# Patient Record
Sex: Female | Born: 1980 | Race: White | Hispanic: No | Marital: Married | Smoking: Never smoker
Health system: Southern US, Community
[De-identification: ages and names within clinical notes are randomized; demographics above are authoritative.]

## PROBLEM LIST (undated history)

## (undated) DIAGNOSIS — Z872 Personal history of diseases of the skin and subcutaneous tissue: Secondary | ICD-10-CM

## (undated) DIAGNOSIS — G40909 Epilepsy, unspecified, not intractable, without status epilepticus: Secondary | ICD-10-CM

## (undated) DIAGNOSIS — N760 Acute vaginitis: Secondary | ICD-10-CM

## (undated) DIAGNOSIS — B9689 Other specified bacterial agents as the cause of diseases classified elsewhere: Secondary | ICD-10-CM

## (undated) DIAGNOSIS — G44219 Episodic tension-type headache, not intractable: Secondary | ICD-10-CM

## (undated) DIAGNOSIS — N83209 Unspecified ovarian cyst, unspecified side: Secondary | ICD-10-CM

## (undated) DIAGNOSIS — Z8669 Personal history of other diseases of the nervous system and sense organs: Secondary | ICD-10-CM

## (undated) HISTORY — DX: Epilepsy, unspecified, not intractable, without status epilepticus: G40.909

## (undated) HISTORY — DX: Acute vaginitis: N76.0

## (undated) HISTORY — DX: Other specified bacterial agents as the cause of diseases classified elsewhere: B96.89

## (undated) HISTORY — DX: Unspecified ovarian cyst, unspecified side: N83.209

## (undated) HISTORY — DX: Episodic tension-type headache, not intractable: G44.219

## (undated) HISTORY — DX: Personal history of other diseases of the nervous system and sense organs: Z86.69

## (undated) HISTORY — PX: WISDOM TOOTH EXTRACTION: SHX21

## (undated) HISTORY — DX: Personal history of diseases of the skin and subcutaneous tissue: Z87.2

---
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
## 2001-08-13 ENCOUNTER — Encounter (HOSPITAL_COMMUNITY): Admission: RE | Admit: 2001-08-13 | Discharge: 2001-09-12 | Payer: Self-pay

## 2001-09-12 ENCOUNTER — Encounter (HOSPITAL_COMMUNITY): Admission: RE | Admit: 2001-09-12 | Discharge: 2001-10-12 | Payer: Self-pay | Admitting: Orthopedic Surgery

## 2002-03-20 ENCOUNTER — Other Ambulatory Visit: Admission: RE | Admit: 2002-03-20 | Discharge: 2002-03-20 | Payer: Self-pay | Admitting: Obstetrics and Gynecology

## 2002-09-25 ENCOUNTER — Encounter: Payer: Self-pay | Admitting: Internal Medicine

## 2002-09-25 ENCOUNTER — Ambulatory Visit (HOSPITAL_COMMUNITY): Admission: RE | Admit: 2002-09-25 | Discharge: 2002-09-25 | Payer: Self-pay | Admitting: Internal Medicine

## 2002-10-17 ENCOUNTER — Ambulatory Visit (HOSPITAL_COMMUNITY): Admission: RE | Admit: 2002-10-17 | Discharge: 2002-10-17 | Payer: Self-pay | Admitting: Internal Medicine

## 2003-01-02 ENCOUNTER — Ambulatory Visit (HOSPITAL_COMMUNITY): Admission: RE | Admit: 2003-01-02 | Discharge: 2003-01-02 | Payer: Self-pay | Admitting: Internal Medicine

## 2003-01-02 ENCOUNTER — Encounter (INDEPENDENT_AMBULATORY_CARE_PROVIDER_SITE_OTHER): Payer: Self-pay | Admitting: Internal Medicine

## 2003-05-15 ENCOUNTER — Other Ambulatory Visit: Admission: RE | Admit: 2003-05-15 | Discharge: 2003-05-15 | Payer: Self-pay | Admitting: Obstetrics and Gynecology

## 2008-01-30 ENCOUNTER — Encounter: Admission: RE | Admit: 2008-01-30 | Discharge: 2008-01-30 | Payer: Self-pay | Admitting: Otolaryngology

## 2010-08-19 NOTE — Op Note (Signed)
NAME:  Monica Webb, Monica Webb                         ACCOUNT NO.:  [id number]   MEDICAL RECORD NO.:  [id number]                   PATIENT TYPE:  AMB   LOCATION:  DAY                                  FACILITY:  [facility]   PHYSICIAN:  R. Roetta Sessions, M.D.              DATE OF BIRTH:  Oct 11, 1980   DATE OF PROCEDURE:  10/17/2002  DATE OF DISCHARGE:                                  PROCEDURE NOTE   PROCEDURE:  Esophagogastroduodenoscopy with small-bowel biopsy followed by  colonoscopy with ileoscopy with biopsy.   ENDOSCOPIST:  Gerrit Friends. Rourk, M.D.   INDICATIONS FOR PROCEDURE:  The patient is a 30 year old lady with a 2-year  history of right-sided abdominal pain reporting a 50-pound weight loss in  the past 6 months. She also has had some burgundy stools recently. She has  been extensively evaluated already with numerous studies.  CT scan of the  abdomen and pelvis with IV oral contrast here at [facility] on June  24 demonstrated prominent right ovarian parenchyma consistent with a corpus  luteum cyst, a single uterine fibroid, normal appearing appendix.  No other  intrapelvic or intra-abdominal abnormalities were seen.  Colonoscopy and EGD  are now being done to further evaluate her symptoms.  This approach has been  discussed with the patient previously and, again, today at the beside.  Please see my office dictation of September 24, 2002 for more information.   PROCEDURE NOTE:  O2 saturation, blood pressure, pulse and respirations were  monitored throughout the entire procedure.  Conscious sedation: Versed 4 mg  IV, Demerol 75 mg IV in divided doses for both procedures.  Cetacaine spray  for topical oropharyngeal anesthesia.   FINDINGS:  EGD examination of the tubular esophagus revealed no mucosal  abnormalities.  The EG junction was easily traversed.   STOMACH:  The gastric cavity was empty.  It insufflated well with air.  A  thorough examination of the gastric mucosa including a  retroflex view of the  proximal stomach and esophagogastric junction demonstrated no abnormalities.  The pylorus was patent and easily traversed.   DUODENUM:  Examination of the bulb and the second portion revealed no  abnormalities.   THERAPEUTIC/DIAGNOSTIC MANEUVERS:  Biopsies of the second portion of the  duodenum were taken for histologic study.   The patient tolerated the procedure well and was prepared for colonoscopy.   FINDINGS:  A digital rectal examination revealed no abnormalities.   ENDOSCOPIC FINDINGS:  The prep was excellent.   RECTUM:  Examination of the rectal mucosa including the retroflex view of  the anal verge revealed minimal internal hemorrhoids, otherwise normal  rectum.   COLON:  The colonic mucosa was surveyed from the rectosigmoid junction  through the left transverse and right colon to the area of the appendiceal  orifice, ileocecal valve, and cecum.  These structures were well seen and  photographed for the record.  The colonic mucosa appeared entirely normal  all the way to the cecum.  The terminal ileum was intubated to 10 cm.  There  was some prominent lymphoid follicles.  There was some friability of the  ileal mucosa a little more than is typically seen; but, however, there were  no erosions or ulcerations.  I did elect to biopsy the ileal mucosa.   From this level the scope was slowly withdrawn.  All previously mentioned  mucosal surfaces were again seen; and, again, no other abnormalities were  observed.  The patient tolerated the procedure well and was reacted in  endoscopy   ESOPHAGOGASTRODUODENOSCOPY:  1. Normal esophagus, stomach, D1 and D2.  2. Biopsies of D2 taken.   COLONOSCOPY FINDINGS:  1. Normal internal hemorrhoids, otherwise normal rectum, normal colon.  2. Some friability of the ileal mucosa of uncertain clinical significance     biopsied.   RECOMMENDATIONS:  1. Will follow up on path.  2. Further recommendations to  follow.                                               Jonathon Bellows, M.D.    RMR/MEDQ  D:  10/17/2002  T:  10/17/2002  Job:  [id number]   cc:   Kirstie Peri, MD  [address]  [place] [address]  Fax: [phone]

## 2011-04-04 HISTORY — PX: CHOLECYSTECTOMY: SHX55

## 2012-04-21 HISTORY — PX: ABDOMINAL HYSTERECTOMY: SHX81

## 2012-04-29 ENCOUNTER — Ambulatory Visit: Payer: Self-pay | Admitting: Obstetrics and Gynecology

## 2012-04-29 ENCOUNTER — Encounter: Payer: Self-pay | Admitting: Obstetrics and Gynecology

## 2012-04-29 VITALS — BP 110/72 | Temp 98.4°F | Ht 65.0 in | Wt 175.0 lb

## 2012-04-29 DIAGNOSIS — N920 Excessive and frequent menstruation with regular cycle: Secondary | ICD-10-CM

## 2012-04-29 DIAGNOSIS — N760 Acute vaginitis: Secondary | ICD-10-CM

## 2012-04-29 DIAGNOSIS — Z01419 Encounter for gynecological examination (general) (routine) without abnormal findings: Secondary | ICD-10-CM

## 2012-04-29 DIAGNOSIS — N83209 Unspecified ovarian cyst, unspecified side: Secondary | ICD-10-CM

## 2012-04-29 DIAGNOSIS — R102 Pelvic and perineal pain: Secondary | ICD-10-CM

## 2012-04-29 DIAGNOSIS — Z124 Encounter for screening for malignant neoplasm of cervix: Secondary | ICD-10-CM

## 2012-04-29 DIAGNOSIS — N83201 Unspecified ovarian cyst, right side: Secondary | ICD-10-CM

## 2012-04-29 LAB — POCT WET PREP (WET MOUNT)
Whiff Test: NEGATIVE
pH: 4.5

## 2012-04-29 LAB — CBC
HCT: 39 % (ref 36.0–46.0)
Hemoglobin: 13.7 g/dL (ref 12.0–15.0)
MCH: 30.6 pg (ref 26.0–34.0)
MCHC: 35.1 g/dL (ref 30.0–36.0)
MCV: 87.1 fL (ref 78.0–100.0)
Platelets: 223 10*3/uL (ref 150–400)
RBC: 4.48 MIL/uL (ref 3.87–5.11)
RDW: 13.4 % (ref 11.5–15.5)
WBC: 6.1 10*3/uL (ref 4.0–10.5)

## 2012-04-29 NOTE — Patient Instructions (Signed)
Avoid: - excess soap on genital area (consider using plain oatmeal soap) - use of powder or sprays in genital area - douching - wearing underwear to bed (except with menses) - using more than is directed detergent when washing clothes - tight fitting garments around genital area - excess sugar intake   

## 2012-04-29 NOTE — Progress Notes (Signed)
Subjective:    Monica Webb is a 32 y.o. female, [phone], who presents for an annual exam. The patient reports the discovery of a large right ovarian cyst by MRI after she experienced a sharp pain on Christmas day that recurred off & on and became stabbing in nature.  She then, after a few weeks  she developed bloody stools with nausea, vomiting and intense pelvic pain.  At the ER an MRI showed, per patient, a large right ovarian cyst.  She was then treated with pain medications and told to follow up with her GYN. In the interim she experience a large volume of vaginal bleeding (not her period) that required the change of an overnight pad and super tampon every 3 hours x 6 days afterwards her pain was much better. Admits to feeling tired,  hemorrhoids, and nausea/vomiting with pain.   She denies urinary tract symptoms, intermenstrual bleeding, dyspareunia or previous history of ovarian cysts.  She went on to report that since her miscarriage in February 2013, she has had monthly yeast like symptoms that go away and return.  She also noticed on yesterday a non-tender "place" on her right thigh.  Denies any know trauma, scratch or exposure to anyone with a rash.   Menstrual cycle:   LMP: Patient's last menstrual period was 04/21/2012.  Flow 4-5 days with pad change every 6 hours; 7/10 cramps relieved with OTC analgesia and time            Review of Systems Pertinent items are noted in HPI. Denies pelvic pain, urinary tract symptoms, vaginitis symptoms, irregular bleeding, menopausal symptoms, change in bowel habits or rectal bleeding   Objective:    BP 110/72  Temp 98.4 F (36.9 C) (Oral)  Ht 5\' 5"  (1.651 m)  Wt 175 lb (79.379 kg)  BMI 29.12 kg/m2  LMP 04/21/2012   Wt Readings from Last 1 Encounters:  04/29/12 175 lb (79.379 kg)   Body mass index is 29.12 kg/(m^2). General Appearance: Alert, no acute distress HEENT: Grossly normal Neck / Thyroid: Supple, no thyromegaly or cervical  adenopathy Lungs: Clear to auscultation bilaterally Back: No CVA tenderness Breast Exam: No masses or nodes.No dimpling, nipple retraction or discharge. Cardiovascular: Regular rate and rhythm.  Gastrointestinal: Soft, diffusely tender without guarding, no masses or organomegaly Pelvic Exam: EGBUS-wnl, vagina-normal rugae, cervix- without lesions or tenderness, uterus appears normal size shape and consistency but tender, adnexae- tenderness on right but not on the left, no palpable masses in either adnexa Lymphatic Exam: Non-palpable nodes in neck, clavicular,  axillary, or inguinal regions  Skin: no rashes or abnormalities Extremities: no clubbing cyanosis or edema;  right anterior thigh with a 3 mm, stellate macular lesion that is not tender but has flaky borders with central pigmentation.  Neurologic: grossly normal Psychiatric: Alert and oriented   Wet Prep: pH-4.5, whiff-negative, no clue, yeast or trich   Assessment:   Routine GYN Exam History of "Large" Cyst on Right Ovary Vaginitis Symptoms Pelvic Pain-improving Menorrhagia/Fatigue Right Thigh Lesion   Plan:  CBC & U/S to evaluate right ovarian cyst  F/U with PCP for thigh lesion  Avoid: - excess soap on genital area (consider using plain oatmeal soap) - use of powder or sprays in genital area - douching - wearing underwear to bed (except with menses) - using more than is directed detergent when washing clothes - tight fitting garments around genital area - excess sugar intake   PAP sent  RTO 1 year or prn  Jazarah Capili,ELMIRAPA-C

## 2012-04-29 NOTE — Progress Notes (Signed)
Regular Periods: yes Mammogram: yes  Monthly Breast Ex.: yes Exercise: yes  Tetanus < 10 years: yes Seatbelts: yes  NI. Bladder Functn.: yes Abuse at home: no  Daily BM's: yes Stressful Work: no  Healthy Diet: yes Sigmoid-Colonoscopy: yes; 2001  Showed polyps  Calcium: no Medical problems this year: ovarian cyst    LAST PAP:3 years ago  Contraception: withdrawal  Mammogram:  6/12    Found small cyst on left breast  PCP: DR. Bruna Potter AT [facility]  PMH: NO CHANGE  FMH: NO CHANGE  Last Bone Scan: NO  PT IS MARRIED.

## 2012-04-30 LAB — PAP IG W/ RFLX HPV ASCU

## 2012-05-08 ENCOUNTER — Encounter: Payer: Self-pay | Admitting: Obstetrics and Gynecology

## 2012-05-08 ENCOUNTER — Ambulatory Visit: Payer: Self-pay | Admitting: Obstetrics and Gynecology

## 2012-05-08 ENCOUNTER — Ambulatory Visit: Payer: Self-pay

## 2012-05-08 VITALS — BP 100/62 | Temp 97.9°F | Wt 178.0 lb

## 2012-05-08 DIAGNOSIS — R102 Pelvic and perineal pain: Secondary | ICD-10-CM

## 2012-05-08 DIAGNOSIS — N83201 Unspecified ovarian cyst, right side: Secondary | ICD-10-CM

## 2012-05-08 DIAGNOSIS — N83209 Unspecified ovarian cyst, unspecified side: Secondary | ICD-10-CM

## 2012-05-08 NOTE — Progress Notes (Signed)
31 YO seen previously with a right ovarian cyst and pelvic pain returns for follow up ultrasound.  Since miscarriage has had frequent yeast symptoms. Symptoms have not responded consistently to any treatment.  Over the past month has had fingertips to get cold, go numb and look white.  Episodes will last about 7 minutes.  Hasn't had the experience with exposure to cold or cold water.   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 Raynaud's Phenomenon   P:  Follow up with Dr. Reuel Boom for Raynaud's like symptoms       Handout given on Cytolytic Vaginosis Management       Patient may call back for referral to the vaginitis clinic at [facility]       May take NSAIDs for pelvic discomfort until pelvic fluid resolves       RTO-as scheduled or prn  Brycelynn Stampley, PA-C

## 2014-02-02 ENCOUNTER — Encounter: Payer: Self-pay | Admitting: Obstetrics and Gynecology

## 2015-07-06 DIAGNOSIS — S60551A Superficial foreign body of right hand, initial encounter: Secondary | ICD-10-CM | POA: Diagnosis not present

## 2015-07-06 DIAGNOSIS — D485 Neoplasm of uncertain behavior of skin: Secondary | ICD-10-CM | POA: Diagnosis not present

## 2015-07-06 DIAGNOSIS — D225 Melanocytic nevi of trunk: Secondary | ICD-10-CM | POA: Diagnosis not present

## 2015-07-07 DIAGNOSIS — N801 Endometriosis of ovary: Secondary | ICD-10-CM | POA: Diagnosis not present

## 2015-07-07 DIAGNOSIS — R102 Pelvic and perineal pain: Secondary | ICD-10-CM | POA: Diagnosis not present

## 2015-07-07 DIAGNOSIS — N920 Excessive and frequent menstruation with regular cycle: Secondary | ICD-10-CM | POA: Diagnosis not present

## 2015-07-07 DIAGNOSIS — N72 Inflammatory disease of cervix uteri: Secondary | ICD-10-CM | POA: Diagnosis not present

## 2015-07-07 DIAGNOSIS — N800 Endometriosis of uterus: Secondary | ICD-10-CM | POA: Diagnosis not present

## 2015-07-12 DIAGNOSIS — R102 Pelvic and perineal pain: Secondary | ICD-10-CM | POA: Diagnosis not present

## 2015-07-12 DIAGNOSIS — D509 Iron deficiency anemia, unspecified: Secondary | ICD-10-CM | POA: Diagnosis not present

## 2015-07-15 ENCOUNTER — Other Ambulatory Visit (HOSPITAL_COMMUNITY): Payer: Self-pay | Admitting: Obstetrics & Gynecology

## 2015-07-15 ENCOUNTER — Ambulatory Visit (HOSPITAL_COMMUNITY)
Admission: RE | Admit: 2015-07-15 | Discharge: 2015-07-15 | Disposition: A | Discharge status: Home / Self Care | Payer: BLUE CROSS/BLUE SHIELD | Source: Ambulatory Visit | Attending: Obstetrics & Gynecology | Admitting: Obstetrics & Gynecology

## 2015-07-15 DIAGNOSIS — R102 Pelvic and perineal pain: Secondary | ICD-10-CM | POA: Insufficient documentation

## 2015-07-15 DIAGNOSIS — Z9049 Acquired absence of other specified parts of digestive tract: Secondary | ICD-10-CM | POA: Insufficient documentation

## 2015-07-15 DIAGNOSIS — S3720XA Unspecified injury of bladder, initial encounter: Secondary | ICD-10-CM

## 2015-07-15 DIAGNOSIS — Z90710 Acquired absence of both cervix and uterus: Secondary | ICD-10-CM | POA: Diagnosis not present

## 2015-07-15 DIAGNOSIS — Z09 Encounter for follow-up examination after completed treatment for conditions other than malignant neoplasm: Secondary | ICD-10-CM | POA: Diagnosis not present

## 2015-07-15 MED ORDER — IOPAMIDOL (ISOVUE-300) INJECTION 61%
100.0000 mL | Freq: Once | INTRAVENOUS | Status: AC | PRN
  Administered 2015-07-15: 100 mL via INTRAVENOUS

## 2016-04-26 DIAGNOSIS — S338XXA Sprain of other parts of lumbar spine and pelvis, initial encounter: Secondary | ICD-10-CM | POA: Diagnosis not present

## 2016-04-26 DIAGNOSIS — M546 Pain in thoracic spine: Secondary | ICD-10-CM | POA: Diagnosis not present

## 2016-04-26 DIAGNOSIS — S134XXA Sprain of ligaments of cervical spine, initial encounter: Secondary | ICD-10-CM | POA: Diagnosis not present

## 2016-04-27 DIAGNOSIS — S134XXA Sprain of ligaments of cervical spine, initial encounter: Secondary | ICD-10-CM | POA: Diagnosis not present

## 2016-04-27 DIAGNOSIS — S338XXA Sprain of other parts of lumbar spine and pelvis, initial encounter: Secondary | ICD-10-CM | POA: Diagnosis not present

## 2016-04-27 DIAGNOSIS — M546 Pain in thoracic spine: Secondary | ICD-10-CM | POA: Diagnosis not present

## 2016-04-28 DIAGNOSIS — S338XXA Sprain of other parts of lumbar spine and pelvis, initial encounter: Secondary | ICD-10-CM | POA: Diagnosis not present

## 2016-04-28 DIAGNOSIS — M546 Pain in thoracic spine: Secondary | ICD-10-CM | POA: Diagnosis not present

## 2016-04-28 DIAGNOSIS — S134XXA Sprain of ligaments of cervical spine, initial encounter: Secondary | ICD-10-CM | POA: Diagnosis not present

## 2016-05-01 DIAGNOSIS — M546 Pain in thoracic spine: Secondary | ICD-10-CM | POA: Diagnosis not present

## 2016-05-01 DIAGNOSIS — S134XXA Sprain of ligaments of cervical spine, initial encounter: Secondary | ICD-10-CM | POA: Diagnosis not present

## 2016-05-01 DIAGNOSIS — S338XXA Sprain of other parts of lumbar spine and pelvis, initial encounter: Secondary | ICD-10-CM | POA: Diagnosis not present

## 2016-05-02 DIAGNOSIS — S134XXA Sprain of ligaments of cervical spine, initial encounter: Secondary | ICD-10-CM | POA: Diagnosis not present

## 2016-05-02 DIAGNOSIS — M546 Pain in thoracic spine: Secondary | ICD-10-CM | POA: Diagnosis not present

## 2016-05-02 DIAGNOSIS — S338XXA Sprain of other parts of lumbar spine and pelvis, initial encounter: Secondary | ICD-10-CM | POA: Diagnosis not present

## 2016-05-03 DIAGNOSIS — S338XXA Sprain of other parts of lumbar spine and pelvis, initial encounter: Secondary | ICD-10-CM | POA: Diagnosis not present

## 2016-05-03 DIAGNOSIS — S134XXA Sprain of ligaments of cervical spine, initial encounter: Secondary | ICD-10-CM | POA: Diagnosis not present

## 2016-05-03 DIAGNOSIS — M546 Pain in thoracic spine: Secondary | ICD-10-CM | POA: Diagnosis not present

## 2016-05-04 DIAGNOSIS — S338XXA Sprain of other parts of lumbar spine and pelvis, initial encounter: Secondary | ICD-10-CM | POA: Diagnosis not present

## 2016-05-04 DIAGNOSIS — M546 Pain in thoracic spine: Secondary | ICD-10-CM | POA: Diagnosis not present

## 2016-05-04 DIAGNOSIS — S134XXA Sprain of ligaments of cervical spine, initial encounter: Secondary | ICD-10-CM | POA: Diagnosis not present

## 2016-05-05 DIAGNOSIS — S338XXA Sprain of other parts of lumbar spine and pelvis, initial encounter: Secondary | ICD-10-CM | POA: Diagnosis not present

## 2016-05-05 DIAGNOSIS — M546 Pain in thoracic spine: Secondary | ICD-10-CM | POA: Diagnosis not present

## 2016-05-05 DIAGNOSIS — S134XXA Sprain of ligaments of cervical spine, initial encounter: Secondary | ICD-10-CM | POA: Diagnosis not present

## 2016-05-11 DIAGNOSIS — S338XXA Sprain of other parts of lumbar spine and pelvis, initial encounter: Secondary | ICD-10-CM | POA: Diagnosis not present

## 2016-05-11 DIAGNOSIS — M546 Pain in thoracic spine: Secondary | ICD-10-CM | POA: Diagnosis not present

## 2016-05-11 DIAGNOSIS — S134XXA Sprain of ligaments of cervical spine, initial encounter: Secondary | ICD-10-CM | POA: Diagnosis not present

## 2016-05-15 DIAGNOSIS — S338XXA Sprain of other parts of lumbar spine and pelvis, initial encounter: Secondary | ICD-10-CM | POA: Diagnosis not present

## 2016-05-15 DIAGNOSIS — M546 Pain in thoracic spine: Secondary | ICD-10-CM | POA: Diagnosis not present

## 2016-05-15 DIAGNOSIS — S134XXA Sprain of ligaments of cervical spine, initial encounter: Secondary | ICD-10-CM | POA: Diagnosis not present

## 2016-05-25 DIAGNOSIS — Z6829 Body mass index (BMI) 29.0-29.9, adult: Secondary | ICD-10-CM | POA: Diagnosis not present

## 2016-05-25 DIAGNOSIS — G40309 Generalized idiopathic epilepsy and epileptic syndromes, not intractable, without status epilepticus: Secondary | ICD-10-CM | POA: Diagnosis not present

## 2016-05-31 DIAGNOSIS — Z6829 Body mass index (BMI) 29.0-29.9, adult: Secondary | ICD-10-CM | POA: Diagnosis not present

## 2016-05-31 DIAGNOSIS — R079 Chest pain, unspecified: Secondary | ICD-10-CM | POA: Diagnosis not present

## 2016-06-28 DIAGNOSIS — G40309 Generalized idiopathic epilepsy and epileptic syndromes, not intractable, without status epilepticus: Secondary | ICD-10-CM | POA: Diagnosis not present

## 2016-06-28 DIAGNOSIS — Z6829 Body mass index (BMI) 29.0-29.9, adult: Secondary | ICD-10-CM | POA: Diagnosis not present

## 2016-07-05 DIAGNOSIS — D235 Other benign neoplasm of skin of trunk: Secondary | ICD-10-CM | POA: Diagnosis not present

## 2016-07-05 DIAGNOSIS — L570 Actinic keratosis: Secondary | ICD-10-CM | POA: Diagnosis not present

## 2016-10-17 IMAGING — CT CT ABD-PEL WO/W CM
3 of 8 series · 13 of 32 positions shown, 18 images · IV contrast (OMNIPAQUE)
Comparison: None.

CLINICAL DATA: 34-year-old female status post hysterectomy
07/07/2015, presents with low pelvic pressure, right greater than
left. Clinical concern for urinary tract injury.

EXAM:
CT ABDOMEN AND PELVIS WITHOUT AND WITH CONTRAST
TECHNIQUE: Multidetector CT imaging of the abdomen and pelvis was performed
following the standard protocol before and following the bolus
administration of intravenous contrast.
CONTRAST:  100mL PQ3V2M-BGG IOPAMIDOL (PQ3V2M-BGG) INJECTION 61%

[Series 2: routine abdomen/pelvis with · axial · 0.67mm/px · z∈[+699,+979]mm · 5 of 85 slices shown (1 of 2)]
[im 15/85  soft-tissue]
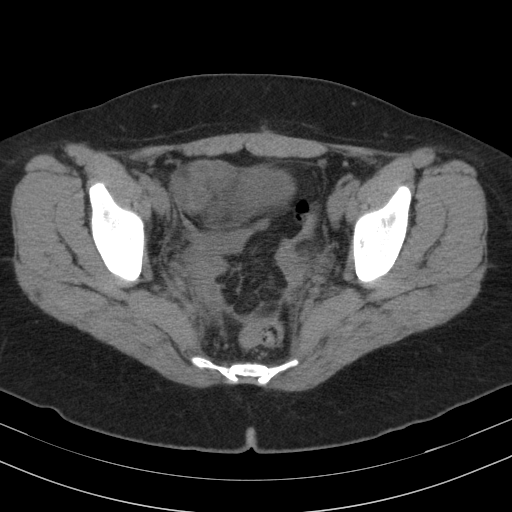
[im 29/85  soft-tissue]
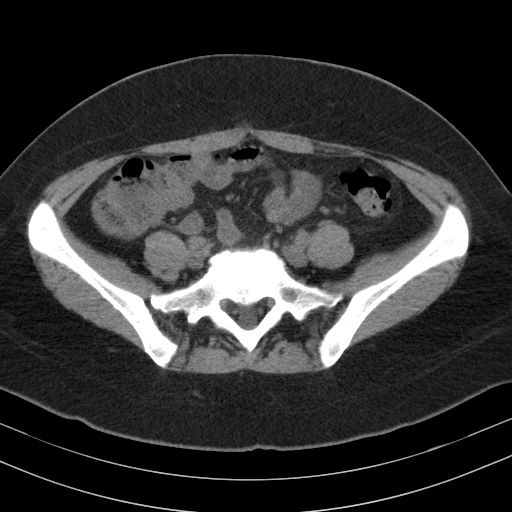
[im 43/85  soft-tissue]
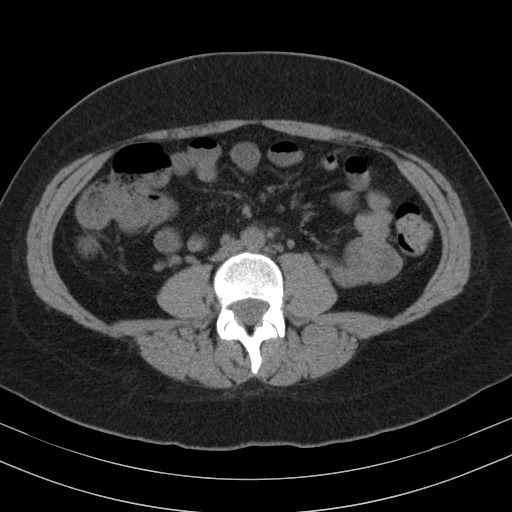
[im 57/85  soft-tissue]
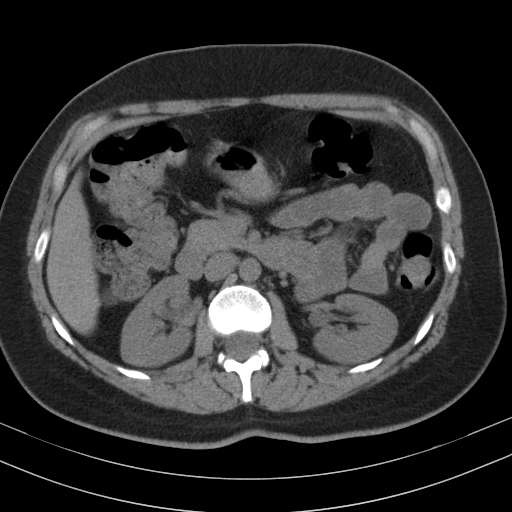
[im 71/85  soft-tissue]
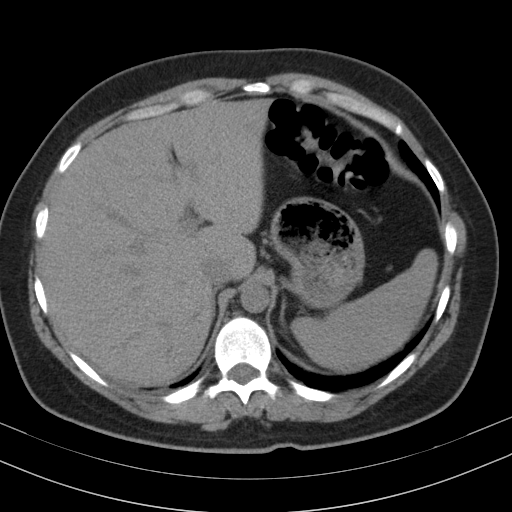

[Series 7: add scan 5.0 b31s · axial · 0.59mm/px · z∈[+659,+724]mm · 2 of 54 slices shown]
[im 14/54  soft-tissue]
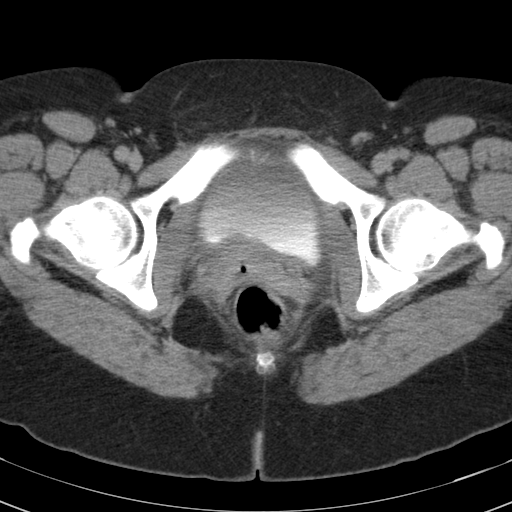
[im 27/54  soft-tissue]
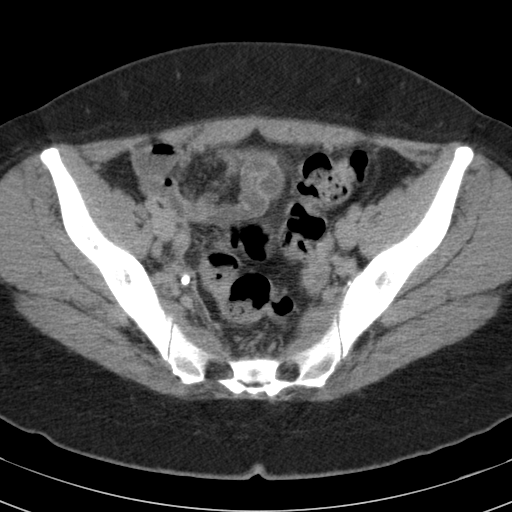

[Series 11: routine abdomen/pelvis with · axial · 0.65mm/px · z∈[+688,+988]mm · 6 of 85 slices shown, 11 images (2 of 2)]
[im 13/85  soft-tissue]
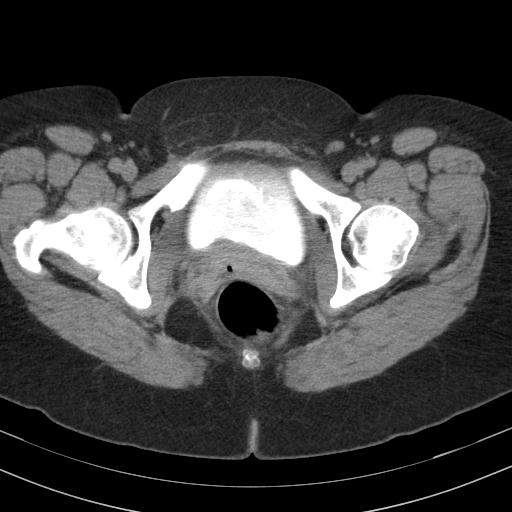
[im 13/85  bone]
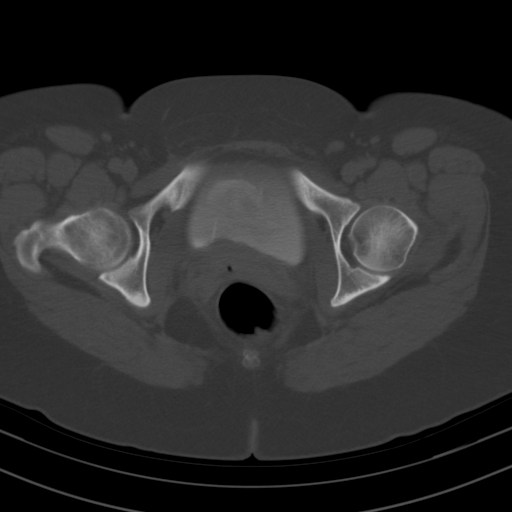
[im 25/85  soft-tissue]
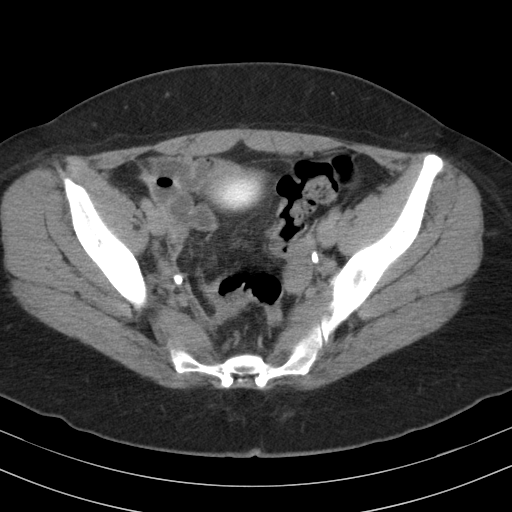
[im 37/85  soft-tissue]
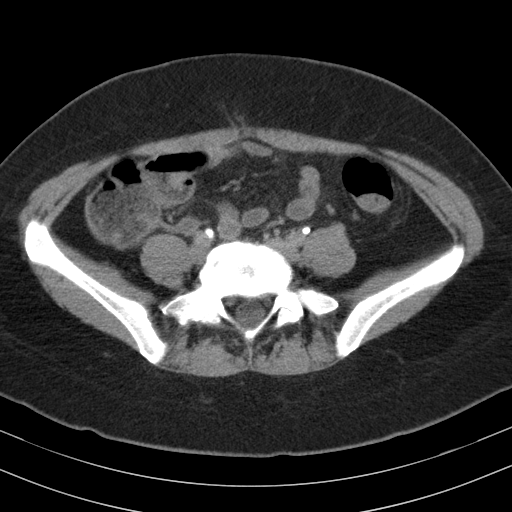
[im 37/85  lung]
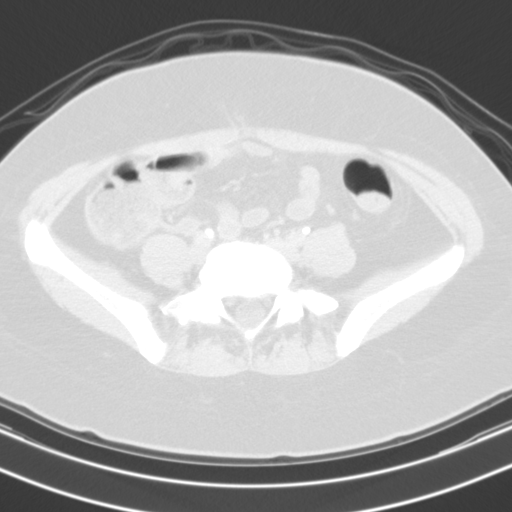
[im 49/85  soft-tissue]
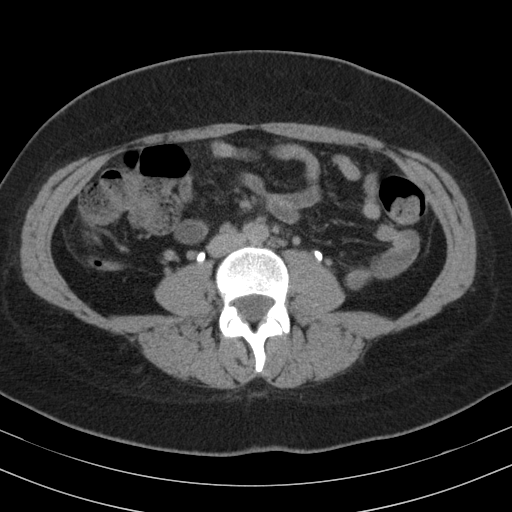
[im 49/85  lung]
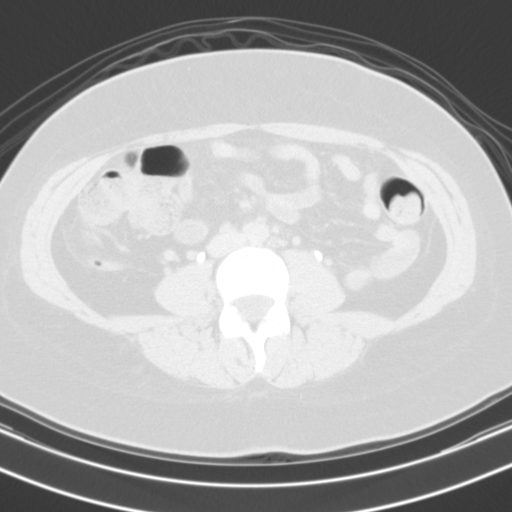
[im 61/85  soft-tissue]
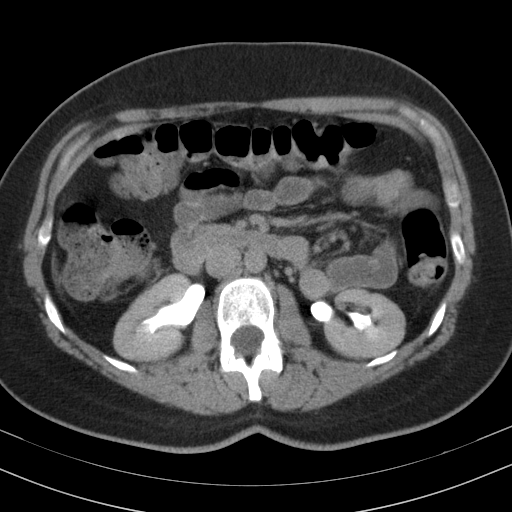
[im 61/85  lung]
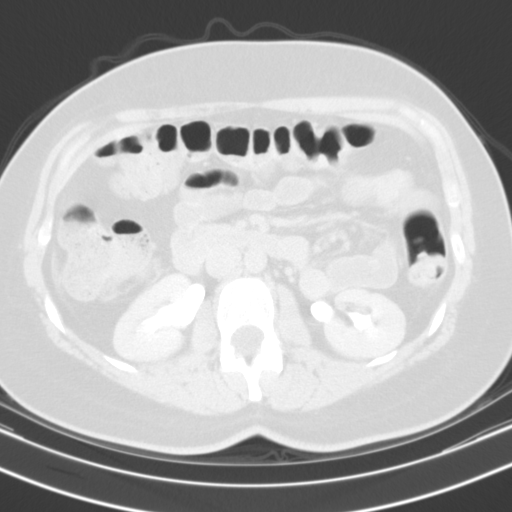
[im 73/85  soft-tissue]
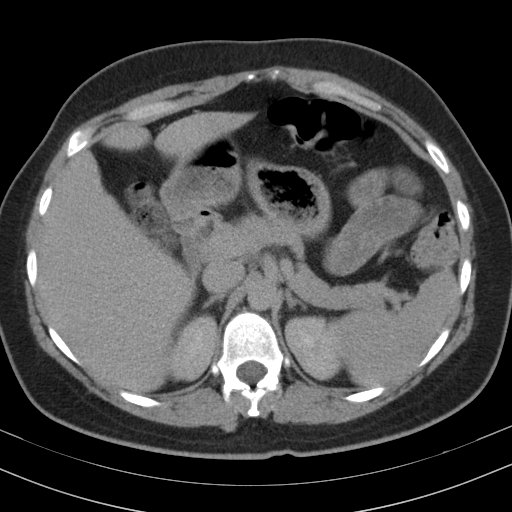
[im 73/85  lung]
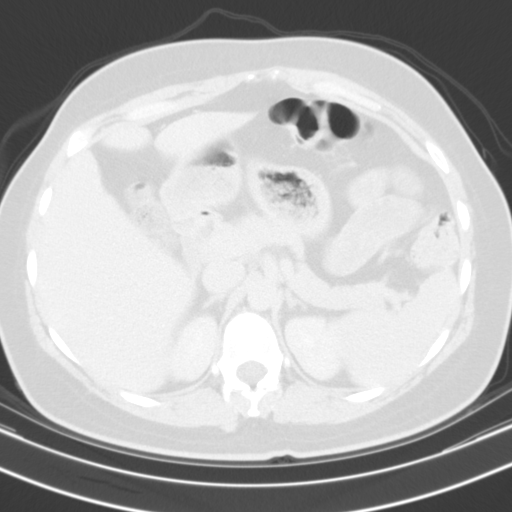

[13 of 32 positions shown; findings below may reference images not displayed]

FINDINGS: Lower chest: No significant pulmonary nodules or acute consolidative
airspace disease. Trace pericardial fluid/ thickening inferiorly.

Hepatobiliary: Normal liver with no liver mass. Cholecystectomy. No
biliary ductal dilatation.

Pancreas: Normal, with no mass or duct dilation.

Spleen: Normal size. No mass.

Adrenals/Urinary Tract: Normal adrenals. No renal stones. No
hydronephrosis. Normal caliber ureters, with no ureteral stones. No
renal cortical masses. On delayed imaging, there is no urothelial
wall thickening and there are no filling defects in the opacified
portions of the bilateral collecting systems or ureters. No evidence
of a contrast leak from the ureters or bladder. Normal bladder, with
no bladder wall thickening, bladder stones or bladder mass.

Stomach/Bowel: Grossly normal stomach. Normal caliber small bowel
with no small bowel wall thickening. Normal appendix. Normal large
bowel with no diverticulosis, large bowel wall thickening or
pericolonic fat stranding.

Vascular/Lymphatic: Normal caliber abdominal aorta. Patent portal,
splenic, hepatic and renal veins. No pathologically enlarged lymph
nodes in the abdomen or pelvis.

Reproductive: Patient is status post hysterectomy. There is
ill-defined fluid and fat stranding surrounding the vaginal cuff. No
focal drainable fluid collection at the vaginal cuff. No adnexal
masses.

Other: No pneumoperitoneum, ascites or focal fluid collection.

Musculoskeletal: No aggressive appearing focal osseous lesions.
Marked degenerative disc disease at L5-S1.
IMPRESSION: 1. No hydronephrosis. No contrast leak from the ureters or bladder
to suggest urinary tract injury.
2. Nonspecific ill-defined fluid and fat stranding surrounding the
vaginal cuff, which could indicate cuff cellulitis. No focal
drainable fluid collections.
These results were called by telephone at the time of interpretation
on 07/15/2015 at [DATE] to Dr. OTT , who verbally acknowledged
these results.

## 2017-06-08 DIAGNOSIS — Z1389 Encounter for screening for other disorder: Secondary | ICD-10-CM | POA: Diagnosis not present

## 2017-06-08 DIAGNOSIS — G40309 Generalized idiopathic epilepsy and epileptic syndromes, not intractable, without status epilepticus: Secondary | ICD-10-CM | POA: Diagnosis not present

## 2017-06-08 DIAGNOSIS — Z6833 Body mass index (BMI) 33.0-33.9, adult: Secondary | ICD-10-CM | POA: Diagnosis not present

## 2017-07-11 DIAGNOSIS — L818 Other specified disorders of pigmentation: Secondary | ICD-10-CM | POA: Diagnosis not present

## 2017-07-11 DIAGNOSIS — L570 Actinic keratosis: Secondary | ICD-10-CM | POA: Diagnosis not present

## 2017-07-11 DIAGNOSIS — D239 Other benign neoplasm of skin, unspecified: Secondary | ICD-10-CM | POA: Diagnosis not present

## 2017-09-10 DIAGNOSIS — R609 Edema, unspecified: Secondary | ICD-10-CM | POA: Diagnosis not present

## 2017-09-10 DIAGNOSIS — R202 Paresthesia of skin: Secondary | ICD-10-CM | POA: Diagnosis not present

## 2017-09-10 DIAGNOSIS — R531 Weakness: Secondary | ICD-10-CM | POA: Diagnosis not present

## 2017-09-10 DIAGNOSIS — H538 Other visual disturbances: Secondary | ICD-10-CM | POA: Diagnosis not present

## 2017-09-10 DIAGNOSIS — R42 Dizziness and giddiness: Secondary | ICD-10-CM | POA: Diagnosis not present

## 2017-12-11 DIAGNOSIS — K581 Irritable bowel syndrome with constipation: Secondary | ICD-10-CM | POA: Diagnosis not present

## 2017-12-11 DIAGNOSIS — J209 Acute bronchitis, unspecified: Secondary | ICD-10-CM | POA: Diagnosis not present

## 2017-12-11 DIAGNOSIS — Z6834 Body mass index (BMI) 34.0-34.9, adult: Secondary | ICD-10-CM | POA: Diagnosis not present

## 2017-12-11 DIAGNOSIS — R05 Cough: Secondary | ICD-10-CM | POA: Diagnosis not present

## 2018-02-13 ENCOUNTER — Ambulatory Visit (INDEPENDENT_AMBULATORY_CARE_PROVIDER_SITE_OTHER): Payer: Self-pay

## 2018-02-13 ENCOUNTER — Ambulatory Visit (INDEPENDENT_AMBULATORY_CARE_PROVIDER_SITE_OTHER): Payer: BLUE CROSS/BLUE SHIELD | Admitting: Orthopaedic Surgery

## 2018-02-13 ENCOUNTER — Encounter (INDEPENDENT_AMBULATORY_CARE_PROVIDER_SITE_OTHER): Payer: Self-pay | Admitting: Orthopaedic Surgery

## 2018-02-13 DIAGNOSIS — M25572 Pain in left ankle and joints of left foot: Secondary | ICD-10-CM | POA: Diagnosis not present

## 2018-02-13 NOTE — Progress Notes (Signed)
Office Visit Note   Patient: Monica Webb           Date of Birth: 01-30-1981           MRN: [id number] Visit Date: 02/13/2018              Requested by: Caryl Bis, MD [place], [place] [address] PCP: Caryl Bis, MD   Assessment & Plan: Visit Diagnoses:  1. Pain in left ankle and joints of left foot     Plan: Sprain lateral aspect left ankle.  Will initially use equalizer boot and reevaluate in 2 weeks.  Consider ASO ankle support at that time.  Crutches if necessary  Follow-Up Instructions: Return in about 2 weeks (around 02/27/2018).   Orders:  Orders Placed This Encounter  Procedures  . XR Ankle Complete Left   No orders of the defined types were placed in this encounter.     Procedures: No procedures performed   Clinical Data: No additional findings.   Subjective: Chief Complaint  Patient presents with  . Follow-up    PT FELL LAST NIGHT AND  HURT LEFT ANKLE  Sustained an inversion stress injury to the ankle falling down the stairs.  Considerable swelling laterally with inability to bear weight.  No related numbness or tingling.  Had at least one prior ankle sprain  HPI  Review of Systems  Constitutional: Negative for fatigue.  HENT: Negative for ear pain.   Eyes: Negative for pain.  Respiratory: Negative for cough and shortness of breath.   Cardiovascular: Negative for leg swelling.  Gastrointestinal: Negative for constipation and diarrhea.  Genitourinary: Negative for difficulty urinating.  Musculoskeletal: Negative for back pain and neck pain.  Skin: Negative for rash.  Allergic/Immunologic: Negative for food allergies.  Neurological: Positive for weakness. Negative for numbness.  Hematological: Does not bruise/bleed easily.  Psychiatric/Behavioral: Positive for sleep disturbance.     Objective: Vital Signs: LMP 04/21/2012   Physical Exam  Constitutional: She is oriented to person, place, and time. She appears well-developed  and well-nourished.  HENT:  Mouth/Throat: Oropharynx is clear and moist.  Eyes: Pupils are equal, round, and reactive to light. EOM are normal.  Pulmonary/Chest: Effort normal.  Neurological: She is alert and oriented to person, place, and time.  Skin: Skin is warm and dry.  Psychiatric: She has a normal mood and affect. Her behavior is normal.    Ortho Exam awake alert and oriented x3.  Comfortable sitting.  Moderate amount of swelling lateral aspect of left ankle with tenderness over the anterior talofibular fibulocalcaneal ligament.  Too uncomfortable to assess stability.  No pain or swelling medially skin intact.  Neurovascular exam intact  Specialty Comments:  No specialty comments available.  Imaging: Xr Ankle Complete Left  Result Date: 02/13/2018 Films of the left ankle obtained in several projections.  There are no acute changes.  Considerable soft tissue swelling laterally.  Ankle mortise intact.  Suggestion of possible subchondral cyst formation in the medial talar dome where the patient is not symptomatic    PMFS History: Patient Active Problem List   Diagnosis Date Noted  . Pain in left ankle and joints of left foot 02/13/2018   Past Medical History:  Diagnosis Date  . BV (bacterial vaginosis)    H/O  . Epilepsy ([place])   . Epilepsy ([place])   . Frequent episodic tension-type headache   . H/O cyst of breast    left  . Hx of migraines   .  Ovarian cyst   . Seizure disorder ([place])     Family History  Problem Relation Age of Onset  . Stroke Paternal Grandfather   . Cancer Paternal Grandfather   . Deep vein thrombosis Paternal Grandmother   . Hypertension Paternal Grandmother   . Diabetes Paternal Grandmother   . Arthritis Paternal Grandmother   . Hypertension Maternal Grandmother   . Arthritis Maternal Grandmother   . Cancer Maternal Grandfather   . Hypertension Father   . Arthritis Father   . Irritable bowel syndrome Mother     Past Surgical History:    Procedure Laterality Date  . CHOLECYSTECTOMY  2013  . TONSILLECTOMY  1994  . WISDOM TOOTH EXTRACTION     Social History   Occupational History  . Not on file  Tobacco Use  . Smoking status: Never Smoker  . Smokeless tobacco: Never Used  Substance and Sexual Activity  . Alcohol use: No  . Drug use: No  . Sexual activity: Yes    Birth control/protection: Coitus interruptus

## 2018-03-06 ENCOUNTER — Ambulatory Visit (INDEPENDENT_AMBULATORY_CARE_PROVIDER_SITE_OTHER): Payer: BLUE CROSS/BLUE SHIELD | Admitting: Orthopaedic Surgery

## 2018-03-06 ENCOUNTER — Encounter (INDEPENDENT_AMBULATORY_CARE_PROVIDER_SITE_OTHER): Payer: Self-pay | Admitting: Orthopaedic Surgery

## 2018-03-06 ENCOUNTER — Ambulatory Visit (INDEPENDENT_AMBULATORY_CARE_PROVIDER_SITE_OTHER): Payer: Self-pay

## 2018-03-06 VITALS — BP 97/55 | HR 80 | Ht 66.0 in | Wt 203.0 lb

## 2018-03-06 DIAGNOSIS — M25572 Pain in left ankle and joints of left foot: Secondary | ICD-10-CM | POA: Diagnosis not present

## 2018-03-06 NOTE — Progress Notes (Signed)
Office Visit Note   Patient: Monica Webb           Date of Birth: 1980-11-14           MRN: [id number] Visit Date: 03/06/2018              Requested by: Caryl Bis, MD [place], [place] [address] PCP: Caryl Bis, MD   Assessment & Plan: Visit Diagnoses:  1. Pain in left ankle and joints of left foot     Plan: Weeks status post injury to her left ankle with symptoms and exam consistent with lateral ankle sprain.  Has been wearing an equalizer boot developed some pain medially with persistent pain laterally.  No new injury or trauma.  X-rays today were negative for any acute changes.  Could have pathology of the posterior tibial tendon based on her exam.  Ankle sprain laterally has not fully healed.  We will place her in an ankle support and reevaluate in a month.  Consider MRI scan  Follow-Up Instructions: Return in about 1 month (around 04/06/2018).   Orders:  Orders Placed This Encounter  Procedures  . XR Ankle Complete Left   No orders of the defined types were placed in this encounter.     Procedures: No procedures performed   Clinical Data: No additional findings.   Subjective: Chief Complaint  Patient presents with  . Left Ankle - Follow-up    DOI 02/12/18  Patient returns for follow up of left ankle sprain. Date of injury was 02/12/18.  She states that she has not seen much improvement. She notices that she cramps a lot after walking in the boot. She continues to have swelling medial heel.   HPI  Review of Systems   Objective: Vital Signs: BP (!) 97/55   Pulse 80   Ht 5\' 6"  (1.676 m)   Wt 203 lb (92.1 kg)   LMP 04/21/2012   BMI 32.77 kg/m   Physical Exam  Constitutional: She is oriented to person, place, and time. She appears well-developed and well-nourished.  HENT:  Mouth/Throat: Oropharynx is clear and moist.  Eyes: Pupils are equal, round, and reactive to light. EOM are normal.  Pulmonary/Chest: Effort normal.  Neurological:  She is alert and oriented to person, place, and time.  Skin: Skin is warm and dry.  Psychiatric: She has a normal mood and affect. Her behavior is normal.    Ortho Exam left ankle with tenderness behind the medial malleolus in the area the posterior tibial tendon extending along the heel to the navicular.  Has active dorsiflexion plantarflexion eversion and inversion.  Still has some tenderness over the lateral malleolus and anterior talofibular ligament without obvious instability.  Skin intact.  Neurovascular exam  Specialty Comments:  No specialty comments available.  Imaging: Xr Ankle Complete Left  Result Date: 03/06/2018 Repeat films of her left ankle were negative for any obvious acute changes i.e. fractures.  The mortise is intact.  Might have a osteochondral lesion of the medial talar dome with subchondral sclerosis no obvious loose material    PMFS History: Patient Active Problem List   Diagnosis Date Noted  . Pain in left ankle and joints of left foot 02/13/2018   Past Medical History:  Diagnosis Date  . BV (bacterial vaginosis)    H/O  . Epilepsy ([place])   . Epilepsy ([place])   . Frequent episodic tension-type headache   . H/O cyst of breast    left  .  Hx of migraines   . Ovarian cyst   . Seizure disorder ([place])     Family History  Problem Relation Age of Onset  . Stroke Paternal Grandfather   . Cancer Paternal Grandfather   . Deep vein thrombosis Paternal Grandmother   . Hypertension Paternal Grandmother   . Diabetes Paternal Grandmother   . Arthritis Paternal Grandmother   . Hypertension Maternal Grandmother   . Arthritis Maternal Grandmother   . Cancer Maternal Grandfather   . Hypertension Father   . Arthritis Father   . Irritable bowel syndrome Mother     Past Surgical History:  Procedure Laterality Date  . CHOLECYSTECTOMY  2013  . TONSILLECTOMY  1994  . WISDOM TOOTH EXTRACTION     Social History   Occupational History  . Not on file  Tobacco Use    . Smoking status: Never Smoker  . Smokeless tobacco: Never Used  Substance and Sexual Activity  . Alcohol use: No  . Drug use: No  . Sexual activity: Yes    Birth control/protection: Coitus interruptus

## 2018-03-21 ENCOUNTER — Telehealth (INDEPENDENT_AMBULATORY_CARE_PROVIDER_SITE_OTHER): Payer: Self-pay | Admitting: Orthopaedic Surgery

## 2018-03-21 NOTE — Telephone Encounter (Signed)
Patient left a voicemail stating she is still having ankle pain and is leaving for [place] this weekend and requests a prescription of pain medicine.

## 2018-03-22 NOTE — Telephone Encounter (Signed)
Tramadol 50 mg #30 1-2 tabs po bid prn

## 2018-03-22 NOTE — Telephone Encounter (Signed)
Thank you :)

## 2018-03-22 NOTE — Telephone Encounter (Signed)
Please advise 

## 2018-04-10 ENCOUNTER — Ambulatory Visit (INDEPENDENT_AMBULATORY_CARE_PROVIDER_SITE_OTHER): Payer: BLUE CROSS/BLUE SHIELD | Admitting: Orthopaedic Surgery

## 2018-04-10 ENCOUNTER — Encounter (INDEPENDENT_AMBULATORY_CARE_PROVIDER_SITE_OTHER): Payer: Self-pay | Admitting: Orthopaedic Surgery

## 2018-04-10 VITALS — BP 111/70 | HR 66 | Ht 66.0 in | Wt 203.0 lb

## 2018-04-10 DIAGNOSIS — M25572 Pain in left ankle and joints of left foot: Secondary | ICD-10-CM | POA: Diagnosis not present

## 2018-04-10 NOTE — Progress Notes (Signed)
Office Visit Note   Patient: Monica Webb           Date of Birth: [birth year]           MRN: [id number] Visit Date: 04/10/2018              Requested by: Caryl Bis, MD [place], [place] [address] PCP: Caryl Bis, MD   Assessment & Plan: Visit Diagnoses:  1. Pain in left ankle and joints of left foot     Plan: Persistent pain and swelling left ankle after injury 2 months ago.  Wearing equalizer boot and then an ankle support still having some recurrent swelling with weightbearing and in both malleoli.  I think it is time to obtain an MRI scan with the possibility of either flexor or extensor tendon injuries.discussed at length  Follow-Up Instructions: Return after MRI left ankle.   Orders:  Orders Placed This Encounter  Procedures  . MR Ankle Left w/o contrast   No orders of the defined types were placed in this encounter.     Procedures: No procedures performed   Clinical Data: No additional findings.   Subjective: Chief Complaint  Patient presents with  . Left Ankle - Follow-up    DOI 02/12/18  Patient returns for follow up of left ankle sprain.  Her initial injury date was 02/12/18.  She has been wearing the ASO daily. She states that she did a lot of walking while in [place] and had to go back into her CAM boot temporarily.  She states that the ankle still swells. She has had some decreased pain except with certain movements, but has persistent medial ankle pain.  HPI  Review of Systems   Objective: Vital Signs: BP 111/70   Pulse 66   Ht 5\' 6"  (1.676 m)   Wt 203 lb (92.1 kg)   LMP 04/21/2012   BMI 32.77 kg/m   Physical Exam Constitutional:      Appearance: She is well-developed.  Eyes:     Pupils: Pupils are equal, round, and reactive to light.  Pulmonary:     Effort: Pulmonary effort is normal.  Skin:    General: Skin is warm and dry.  Neurological:     Mental Status: She is alert and oriented to person, place, and time.    Psychiatric:        Behavior: Behavior normal.     Ortho Exam alert and oriented x3.  Comfortable sitting.  Left ankle still some swelling-nonpitting.  Some pain over the anterior talofibular ligament and the deltoid ligament without instability.  Also having some pain along the posterior tibial tendon and the peroneal tendons with mild swelling.  Tendon function intact.  Some burning in the dorsum of her foot with the possibility of a stretch injury to 1 of the peroneal nerve branches.  No toe edema.  Good sensibility.  No heel pain or Achilles discomfort.  No plantar pain.  Good capillary refill to toes  Specialty Comments:  No specialty comments available.  Imaging: No results found.   PMFS History: Patient Active Problem List   Diagnosis Date Noted  . Pain in left ankle and joints of left foot 02/13/2018   Past Medical History:  Diagnosis Date  . BV (bacterial vaginosis)    H/O  . Epilepsy ([facility])   . Epilepsy ([facility])   . Frequent episodic tension-type headache   . H/O cyst of breast    left  . Hx of  migraines   . Ovarian cyst   . Seizure disorder ([facility])     Family History  Problem Relation Age of Onset  . Stroke Paternal Grandfather   . Cancer Paternal Grandfather   . Deep vein thrombosis Paternal Grandmother   . Hypertension Paternal Grandmother   . Diabetes Paternal Grandmother   . Arthritis Paternal Grandmother   . Hypertension Maternal Grandmother   . Arthritis Maternal Grandmother   . Cancer Maternal Grandfather   . Hypertension Father   . Arthritis Father   . Irritable bowel syndrome Mother     Past Surgical History:  Procedure Laterality Date  . CHOLECYSTECTOMY  2013  . TONSILLECTOMY  1994  . WISDOM TOOTH EXTRACTION     Social History   Occupational History  . Not on file  Tobacco Use  . Smoking status: Never Smoker  . Smokeless tobacco: Never Used  Substance and Sexual Activity  . Alcohol use: No  . Drug use: No  . Sexual activity: Yes     Birth control/protection: Coitus interruptus

## 2018-04-12 DIAGNOSIS — S93492A Sprain of other ligament of left ankle, initial encounter: Secondary | ICD-10-CM | POA: Diagnosis not present

## 2018-04-12 DIAGNOSIS — M25572 Pain in left ankle and joints of left foot: Secondary | ICD-10-CM | POA: Diagnosis not present

## 2018-04-12 DIAGNOSIS — W19XXXA Unspecified fall, initial encounter: Secondary | ICD-10-CM | POA: Diagnosis not present

## 2018-04-17 ENCOUNTER — Ambulatory Visit (INDEPENDENT_AMBULATORY_CARE_PROVIDER_SITE_OTHER): Payer: BLUE CROSS/BLUE SHIELD | Admitting: Orthopaedic Surgery

## 2018-04-17 ENCOUNTER — Encounter (INDEPENDENT_AMBULATORY_CARE_PROVIDER_SITE_OTHER): Payer: Self-pay | Admitting: Orthopaedic Surgery

## 2018-04-17 VITALS — BP 112/71 | HR 82 | Ht 66.0 in | Wt 203.0 lb

## 2018-04-17 DIAGNOSIS — M25572 Pain in left ankle and joints of left foot: Secondary | ICD-10-CM

## 2018-04-17 NOTE — Progress Notes (Signed)
Office Visit Note   Patient: Monica Webb           Date of Birth: March 29, 1981           MRN: [id number] Visit Date: 04/17/2018              Requested by: Caryl Bis, MD [place], [place] [address] PCP: Caryl Bis, MD   Assessment & Plan: Visit Diagnoses:  1. Pain in left ankle and joints of left foot     Plan: MRI scan demonstrates a complete tear of the anterior talofibular ligament.  Fibulocalcaneal ligament is intact.  There is a strain of the tib-fib ligament without a tear.  Ankle tendons intact.  There is some bruising of the medial talar dome.  Probable strain of deltoid ligament without tear Long discussion with Monica Webb regarding the findings.  Will wear the ASO support and try a course of physical therapy.  Hopefully time will heal  Follow-Up Instructions: Return if symptoms worsen or fail to improve.   Orders:  Orders Placed This Encounter  Procedures  . Ambulatory referral to Physical Therapy   No orders of the defined types were placed in this encounter.     Procedures: No procedures performed   Clinical Data: No additional findings.   Subjective: Chief Complaint  Patient presents with  . Left Ankle - Follow-up    MRI Left Ankle Review  Patient returns to the office to review MRI Left Ankle.  No change in ankle symptoms.  Does have some discomfort along the medial lateral aspect of her ankle.  No related numbness or tingling.  Occasionally will have some swelling  HPI  Review of Systems   Objective: Vital Signs: BP 112/71   Pulse 82   Ht 5\' 6"  (1.676 m)   Wt 203 lb (92.1 kg)   LMP 04/21/2012   BMI 32.77 kg/m   Physical Exam Constitutional:      Appearance: She is well-developed.  Eyes:     Pupils: Pupils are equal, round, and reactive to light.  Pulmonary:     Effort: Pulmonary effort is normal.  Skin:    General: Skin is warm and dry.  Neurological:     Mental Status: She is alert and oriented to person, place, and  time.  Psychiatric:        Behavior: Behavior normal.     Ortho Exam awake alert and oriented x3.  Comfortable sitting.  Left ankle without any swelling.  Skin intact.  Neurovascular exam intact.  Has some tenderness over the anterior talofibular ligament.  Mild tenderness over the deltoid ligament.  Tendon function intact.  No obvious instability by exam.  R pain or discomfort about the Achilles tendon  Specialty Comments:  No specialty comments available.  Imaging: No results found.   PMFS History: Patient Active Problem List   Diagnosis Date Noted  . Pain in left ankle and joints of left foot 02/13/2018   Past Medical History:  Diagnosis Date  . BV (bacterial vaginosis)    H/O  . Epilepsy ([facility])   . Epilepsy ([facility])   . Frequent episodic tension-type headache   . H/O cyst of breast    left  . Hx of migraines   . Ovarian cyst   . Seizure disorder ([facility])     Family History  Problem Relation Age of Onset  . Stroke Paternal Grandfather   . Cancer Paternal Grandfather   . Deep vein thrombosis Paternal  Grandmother   . Hypertension Paternal Grandmother   . Diabetes Paternal Grandmother   . Arthritis Paternal Grandmother   . Hypertension Maternal Grandmother   . Arthritis Maternal Grandmother   . Cancer Maternal Grandfather   . Hypertension Father   . Arthritis Father   . Irritable bowel syndrome Mother     Past Surgical History:  Procedure Laterality Date  . CHOLECYSTECTOMY  2013  . TONSILLECTOMY  1994  . WISDOM TOOTH EXTRACTION     Social History   Occupational History  . Not on file  Tobacco Use  . Smoking status: Never Smoker  . Smokeless tobacco: Never Used  Substance and Sexual Activity  . Alcohol use: No  . Drug use: No  . Sexual activity: Yes    Birth control/protection: Coitus interruptus

## 2018-04-23 DIAGNOSIS — Z6834 Body mass index (BMI) 34.0-34.9, adult: Secondary | ICD-10-CM | POA: Diagnosis not present

## 2018-04-23 DIAGNOSIS — R300 Dysuria: Secondary | ICD-10-CM | POA: Diagnosis not present

## 2018-04-24 DIAGNOSIS — S93402A Sprain of unspecified ligament of left ankle, initial encounter: Secondary | ICD-10-CM | POA: Diagnosis not present

## 2018-04-24 DIAGNOSIS — M25672 Stiffness of left ankle, not elsewhere classified: Secondary | ICD-10-CM | POA: Diagnosis not present

## 2018-04-24 DIAGNOSIS — M25572 Pain in left ankle and joints of left foot: Secondary | ICD-10-CM | POA: Diagnosis not present

## 2018-04-30 DIAGNOSIS — M25572 Pain in left ankle and joints of left foot: Secondary | ICD-10-CM | POA: Diagnosis not present

## 2018-04-30 DIAGNOSIS — S93402A Sprain of unspecified ligament of left ankle, initial encounter: Secondary | ICD-10-CM | POA: Diagnosis not present

## 2018-04-30 DIAGNOSIS — M25672 Stiffness of left ankle, not elsewhere classified: Secondary | ICD-10-CM | POA: Diagnosis not present

## 2018-05-02 DIAGNOSIS — R05 Cough: Secondary | ICD-10-CM | POA: Diagnosis not present

## 2018-05-02 DIAGNOSIS — Z6834 Body mass index (BMI) 34.0-34.9, adult: Secondary | ICD-10-CM | POA: Diagnosis not present

## 2018-05-03 DIAGNOSIS — M25572 Pain in left ankle and joints of left foot: Secondary | ICD-10-CM | POA: Diagnosis not present

## 2018-05-03 DIAGNOSIS — S93402A Sprain of unspecified ligament of left ankle, initial encounter: Secondary | ICD-10-CM | POA: Diagnosis not present

## 2018-05-03 DIAGNOSIS — M25672 Stiffness of left ankle, not elsewhere classified: Secondary | ICD-10-CM | POA: Diagnosis not present

## 2018-05-07 DIAGNOSIS — M25672 Stiffness of left ankle, not elsewhere classified: Secondary | ICD-10-CM | POA: Diagnosis not present

## 2018-05-07 DIAGNOSIS — M25572 Pain in left ankle and joints of left foot: Secondary | ICD-10-CM | POA: Diagnosis not present

## 2018-05-07 DIAGNOSIS — S93402A Sprain of unspecified ligament of left ankle, initial encounter: Secondary | ICD-10-CM | POA: Diagnosis not present

## 2018-07-09 DIAGNOSIS — L570 Actinic keratosis: Secondary | ICD-10-CM | POA: Diagnosis not present

## 2018-07-09 DIAGNOSIS — L719 Rosacea, unspecified: Secondary | ICD-10-CM | POA: Diagnosis not present

## 2018-07-09 DIAGNOSIS — D239 Other benign neoplasm of skin, unspecified: Secondary | ICD-10-CM | POA: Diagnosis not present

## 2018-09-18 DIAGNOSIS — B373 Candidiasis of vulva and vagina: Secondary | ICD-10-CM | POA: Diagnosis not present

## 2018-12-06 DIAGNOSIS — Z6834 Body mass index (BMI) 34.0-34.9, adult: Secondary | ICD-10-CM | POA: Diagnosis not present

## 2018-12-06 DIAGNOSIS — B373 Candidiasis of vulva and vagina: Secondary | ICD-10-CM | POA: Diagnosis not present

## 2018-12-06 DIAGNOSIS — K5900 Constipation, unspecified: Secondary | ICD-10-CM | POA: Diagnosis not present

## 2019-01-29 DIAGNOSIS — S338XXA Sprain of other parts of lumbar spine and pelvis, initial encounter: Secondary | ICD-10-CM | POA: Diagnosis not present

## 2019-01-29 DIAGNOSIS — S93402A Sprain of unspecified ligament of left ankle, initial encounter: Secondary | ICD-10-CM | POA: Diagnosis not present

## 2019-01-31 DIAGNOSIS — S338XXA Sprain of other parts of lumbar spine and pelvis, initial encounter: Secondary | ICD-10-CM | POA: Diagnosis not present

## 2019-01-31 DIAGNOSIS — S93402A Sprain of unspecified ligament of left ankle, initial encounter: Secondary | ICD-10-CM | POA: Diagnosis not present

## 2019-02-03 DIAGNOSIS — S338XXA Sprain of other parts of lumbar spine and pelvis, initial encounter: Secondary | ICD-10-CM | POA: Diagnosis not present

## 2019-02-03 DIAGNOSIS — S93402A Sprain of unspecified ligament of left ankle, initial encounter: Secondary | ICD-10-CM | POA: Diagnosis not present

## 2019-03-14 DIAGNOSIS — R110 Nausea: Secondary | ICD-10-CM | POA: Diagnosis not present

## 2019-03-14 DIAGNOSIS — R1012 Left upper quadrant pain: Secondary | ICD-10-CM | POA: Diagnosis not present

## 2019-03-14 DIAGNOSIS — R8290 Unspecified abnormal findings in urine: Secondary | ICD-10-CM | POA: Diagnosis not present

## 2019-04-23 DIAGNOSIS — Z6835 Body mass index (BMI) 35.0-35.9, adult: Secondary | ICD-10-CM | POA: Diagnosis not present

## 2019-04-23 DIAGNOSIS — M25572 Pain in left ankle and joints of left foot: Secondary | ICD-10-CM | POA: Diagnosis not present

## 2019-04-23 DIAGNOSIS — B373 Candidiasis of vulva and vagina: Secondary | ICD-10-CM | POA: Diagnosis not present

## 2019-05-19 DIAGNOSIS — L0100 Impetigo, unspecified: Secondary | ICD-10-CM | POA: Diagnosis not present

## 2019-07-09 DIAGNOSIS — D225 Melanocytic nevi of trunk: Secondary | ICD-10-CM | POA: Diagnosis not present

## 2019-07-09 DIAGNOSIS — L719 Rosacea, unspecified: Secondary | ICD-10-CM | POA: Diagnosis not present

## 2019-07-09 DIAGNOSIS — D485 Neoplasm of uncertain behavior of skin: Secondary | ICD-10-CM | POA: Diagnosis not present

## 2019-08-13 DIAGNOSIS — Z20828 Contact with and (suspected) exposure to other viral communicable diseases: Secondary | ICD-10-CM | POA: Diagnosis not present

## 2019-08-13 DIAGNOSIS — J309 Allergic rhinitis, unspecified: Secondary | ICD-10-CM | POA: Diagnosis not present

## 2019-08-13 DIAGNOSIS — J0100 Acute maxillary sinusitis, unspecified: Secondary | ICD-10-CM | POA: Diagnosis not present

## 2019-08-16 DIAGNOSIS — J0100 Acute maxillary sinusitis, unspecified: Secondary | ICD-10-CM | POA: Diagnosis not present

## 2019-12-25 DIAGNOSIS — M7551 Bursitis of right shoulder: Secondary | ICD-10-CM | POA: Diagnosis not present

## 2019-12-25 DIAGNOSIS — S161XXA Strain of muscle, fascia and tendon at neck level, initial encounter: Secondary | ICD-10-CM | POA: Diagnosis not present

## 2020-04-13 ENCOUNTER — Encounter: Payer: Self-pay | Admitting: Internal Medicine

## 2020-05-19 ENCOUNTER — Ambulatory Visit: Payer: Self-pay | Admitting: Internal Medicine

## 2020-08-06 ENCOUNTER — Ambulatory Visit (HOSPITAL_COMMUNITY): Payer: 59

## 2020-08-06 ENCOUNTER — Other Ambulatory Visit: Payer: Self-pay | Admitting: Physician Assistant

## 2020-08-06 DIAGNOSIS — R110 Nausea: Secondary | ICD-10-CM

## 2020-08-06 DIAGNOSIS — R10A0 Flank pain, unspecified side: Secondary | ICD-10-CM

## 2020-08-06 DIAGNOSIS — R1011 Right upper quadrant pain: Secondary | ICD-10-CM

## 2020-08-06 DIAGNOSIS — R109 Unspecified abdominal pain: Secondary | ICD-10-CM

## 2020-08-09 ENCOUNTER — Other Ambulatory Visit: Payer: Self-pay

## 2020-08-09 ENCOUNTER — Ambulatory Visit (HOSPITAL_COMMUNITY)
Admission: RE | Admit: 2020-08-09 | Discharge: 2020-08-09 | Disposition: A | Discharge status: Home / Self Care | Payer: 59 | Source: Ambulatory Visit | Attending: Physician Assistant | Admitting: Physician Assistant

## 2020-08-09 DIAGNOSIS — R109 Unspecified abdominal pain: Secondary | ICD-10-CM | POA: Insufficient documentation

## 2020-08-09 DIAGNOSIS — R1011 Right upper quadrant pain: Secondary | ICD-10-CM | POA: Diagnosis present

## 2020-08-09 DIAGNOSIS — R110 Nausea: Secondary | ICD-10-CM

## 2020-08-09 DIAGNOSIS — R10A0 Flank pain, unspecified side: Secondary | ICD-10-CM

## 2020-12-16 ENCOUNTER — Encounter: Payer: Self-pay | Admitting: Family Medicine

## 2021-01-21 ENCOUNTER — Ambulatory Visit: Payer: 59 | Admitting: Cardiology

## 2021-02-03 ENCOUNTER — Ambulatory Visit (HOSPITAL_COMMUNITY)
Admission: RE | Admit: 2021-02-03 | Discharge: 2021-02-03 | Disposition: A | Discharge status: Home / Self Care | Payer: 59 | Source: Ambulatory Visit | Attending: Physician Assistant | Admitting: Physician Assistant

## 2021-02-03 ENCOUNTER — Other Ambulatory Visit: Payer: Self-pay

## 2021-02-03 ENCOUNTER — Other Ambulatory Visit (HOSPITAL_COMMUNITY): Payer: Self-pay | Admitting: Physician Assistant

## 2021-02-03 ENCOUNTER — Other Ambulatory Visit: Payer: Self-pay | Admitting: Physician Assistant

## 2021-02-03 DIAGNOSIS — R29898 Other symptoms and signs involving the musculoskeletal system: Secondary | ICD-10-CM

## 2021-02-03 DIAGNOSIS — R229 Localized swelling, mass and lump, unspecified: Secondary | ICD-10-CM | POA: Diagnosis present

## 2021-02-03 DIAGNOSIS — M79601 Pain in right arm: Secondary | ICD-10-CM | POA: Diagnosis present

## 2021-02-16 ENCOUNTER — Encounter: Payer: Self-pay | Admitting: Family Medicine

## 2021-03-10 ENCOUNTER — Other Ambulatory Visit: Payer: Self-pay

## 2021-03-10 ENCOUNTER — Encounter: Payer: Self-pay | Admitting: General Surgery

## 2021-03-10 ENCOUNTER — Ambulatory Visit (INDEPENDENT_AMBULATORY_CARE_PROVIDER_SITE_OTHER): Payer: 59 | Admitting: General Surgery

## 2021-03-10 VITALS — BP 104/71 | HR 60 | Temp 97.4°F | Resp 12 | Ht 65.0 in | Wt 191.0 lb

## 2021-03-10 DIAGNOSIS — M7989 Other specified soft tissue disorders: Secondary | ICD-10-CM

## 2021-03-11 NOTE — Progress Notes (Signed)
Monica Webb; [id number]; 09-Apr-1980   HPI Is a 40 year old white female who was referred to my care by Gar Ponto for evaluation and treatment of multiple subcutaneous nodules in the right arm.  Patient states they have been present for over 1 month.  They can sometimes be tender to touch.  She has describes some right arm pain and numbness which is variable.  No significant change in size is noted. Patient had an ultrasound of the right arm and this was not considered vascular in nature.  The study was somewhat indeterminant and the patient is being referred for possible biopsy. Past Medical History:  Diagnosis Date   BV (bacterial vaginosis)    H/O   Epilepsy ([facility])    Epilepsy ([facility])    Frequent episodic tension-type headache    H/O cyst of breast    left   Hx of migraines    Ovarian cyst    Seizure disorder [facility])     Past Surgical History:  Procedure Laterality Date   CHOLECYSTECTOMY  2013   TONSILLECTOMY  1994   WISDOM TOOTH EXTRACTION      Family History  Problem Relation Age of Onset   Stroke Paternal Grandfather    Cancer Paternal Grandfather    Deep vein thrombosis Paternal Grandmother    Hypertension Paternal Grandmother    Diabetes Paternal Grandmother    Arthritis Paternal Grandmother    Hypertension Maternal Grandmother    Arthritis Maternal Grandmother    Cancer Maternal Grandfather    Hypertension Father    Arthritis Father    Irritable bowel syndrome Mother     Current Outpatient Medications on File Prior to Visit  Medication Sig Dispense Refill   lamoTRIgine (LAMICTAL) 100 MG tablet Take 200 mg by mouth.      No current facility-administered medications on file prior to visit.    Allergies  Allergen Reactions   Elemental Sulfur Swelling   Penicillins Rash    Social History   Substance and Sexual Activity  Alcohol Use No    Social History   Tobacco Use  Smoking Status Never  Smokeless Tobacco Never    Review of Systems   Constitutional: Negative.   HENT: Negative.    Eyes: Negative.   Respiratory: Negative.    Cardiovascular: Negative.   Gastrointestinal: Negative.   Genitourinary: Negative.   Musculoskeletal: Negative.   Skin: Negative.   Neurological: Negative.   Endo/Heme/Allergies: Negative.   Psychiatric/Behavioral: Negative.     Objective   Vitals:   03/10/21 0943  BP: 104/71  Pulse: 60  Resp: 12  Temp: (!) 97.4 F (36.3 C)  SpO2: 98%    Physical Exam Vitals reviewed.  Constitutional:      Appearance: Normal appearance. She is not ill-appearing.  HENT:     Head: Normocephalic and atraumatic.  Cardiovascular:     Rate and Rhythm: Normal rate and regular rhythm.     Heart sounds: Normal heart sounds. No murmur heard.   No friction rub. No gallop.  Pulmonary:     Effort: Pulmonary effort is normal. No respiratory distress.     Breath sounds: Normal breath sounds. No stridor. No wheezing, rhonchi or rales.  Musculoskeletal:     Comments: Multiple 3 mm subcutaneous mobile nodules present in a nonspecific pattern.  I was able to find those that she was concerned about, but found others in multiple areas of the right arm.  She also had some in the left arm.  Skin:    General:  Skin is warm and dry.  Neurological:     Mental Status: She is alert and oriented to person, place, and time.  Reviewed U/S report  Assessment  Multiple subcutaneous nodules of unknown etiology.   I could not find the specific lesion that ultrasound was talking about. Plan  At this point, I would like to see the patient back in 1 month to reassess the subcutaneous nodules.  She may ultimately need a biopsy of an area of the adipose tissue to make sure she does not have some other systemic process brewing.  She understands this.  I will see her again in 1 month for follow-up.

## 2021-04-12 ENCOUNTER — Ambulatory Visit: Payer: BC Managed Care – PPO | Admitting: General Surgery

## 2021-04-12 ENCOUNTER — Encounter: Payer: Self-pay | Admitting: General Surgery

## 2021-04-12 ENCOUNTER — Other Ambulatory Visit: Payer: Self-pay

## 2021-04-12 VITALS — BP 113/74 | HR 50 | Temp 98.7°F | Resp 16 | Ht 65.0 in | Wt 187.0 lb

## 2021-04-12 DIAGNOSIS — M7989 Other specified soft tissue disorders: Secondary | ICD-10-CM | POA: Diagnosis not present

## 2021-04-12 NOTE — Progress Notes (Signed)
Subjective:     Monica Webb  Here for follow-up of subcutaneous masses of the upper extremity, especially the right arm.  Patient states she feels more than now. Objective:    BP 113/74    Pulse (!) 50    Temp 98.7 F (37.1 C) (Other (Comment))    Resp 16    Ht 5\' 5"  (1.651 m)    Wt 187 lb (84.8 kg)    LMP 04/21/2012    SpO2 97%    BMI 31.12 kg/m   General:  alert, cooperative, and no distress  Right upper extremity at the elbow and right arm does seem to have more pea-sized subcutaneous densities.     Assessment:    Subcutaneous masses of right arm of unknown etiology    Plan:   Patient is scheduled for excision of several of the right arm masses on 04/27/2021.  The risks and benefits of the procedure were fully explained to the patient, who gave informed consent.  Hopefully pathology will be able to identify the etiology of these nodules.

## 2021-04-13 NOTE — H&P (Signed)
Monica Webb; [id number]; 1980/12/27   HPI Is a 41 year old white female who was referred to my care by Monica Webb for evaluation and treatment of multiple subcutaneous nodules in the right arm.  Patient states they have been present for over 1 month.  They can sometimes be tender to touch.  She has describes some right arm pain and numbness which is variable.  No significant change in size is noted. Patient had an ultrasound of the right arm and this was not considered vascular in nature.  The study was somewhat indeterminant and the patient is being referred for possible biopsy. Patient was reexamined on 04/12/2021 and the nodules have persisted. Past Medical History:  Diagnosis Date   BV (bacterial vaginosis)    H/O   Epilepsy ([facility])    Epilepsy ([place])    Frequent episodic tension-type headache    H/O cyst of breast    left   Hx of migraines    Ovarian cyst    Seizure disorder [facility])     Past Surgical History:  Procedure Laterality Date   CHOLECYSTECTOMY  2013   TONSILLECTOMY  1994   WISDOM TOOTH EXTRACTION      Family History  Problem Relation Age of Onset   Stroke Paternal Grandfather    Cancer Paternal Grandfather    Deep vein thrombosis Paternal Grandmother    Hypertension Paternal Grandmother    Diabetes Paternal Grandmother    Arthritis Paternal Grandmother    Hypertension Maternal Grandmother    Arthritis Maternal Grandmother    Cancer Maternal Grandfather    Hypertension Father    Arthritis Father    Irritable bowel syndrome Mother     Current Outpatient Medications on File Prior to Visit  Medication Sig Dispense Refill   lamoTRIgine (LAMICTAL) 100 MG tablet Take 200 mg by mouth.      No current facility-administered medications on file prior to visit.    Allergies  Allergen Reactions   Elemental Sulfur Swelling   Penicillins Rash    Social History   Substance and Sexual Activity  Alcohol Use No    Social History   Tobacco Use  Smoking Status  Never  Smokeless Tobacco Never    Review of Systems  Constitutional: Negative.   HENT: Negative.    Eyes: Negative.   Respiratory: Negative.    Cardiovascular: Negative.   Gastrointestinal: Negative.   Genitourinary: Negative.   Musculoskeletal: Negative.   Skin: Negative.   Neurological: Negative.   Endo/Heme/Allergies: Negative.   Psychiatric/Behavioral: Negative.     Objective   Vitals:   03/10/21 0943  BP: 104/71  Pulse: 60  Resp: 12  Temp: (!) 97.4 F (36.3 C)  SpO2: 98%    Physical Exam Vitals reviewed.  Constitutional:      Appearance: Normal appearance. She is not ill-appearing.  HENT:     Head: Normocephalic and atraumatic.  Cardiovascular:     Rate and Rhythm: Normal rate and regular rhythm.     Heart sounds: Normal heart sounds. No murmur heard.   No friction rub. No gallop.  Pulmonary:     Effort: Pulmonary effort is normal. No respiratory distress.     Breath sounds: Normal breath sounds. No stridor. No wheezing, rhonchi or rales.  Musculoskeletal:     Comments: Multiple 3 mm subcutaneous mobile nodules present in a nonspecific pattern.  I was able to find those that she was concerned about, but found others in multiple areas of the right arm.  She also had some  in the left arm.  Skin:    General: Skin is warm and dry.  Neurological:     Mental Status: She is alert and oriented to person, place, and time.  Reviewed U/S report  Assessment  Multiple subcutaneous nodules of unknown etiology.   I could not find the specific lesion that ultrasound was talking about. Plan  At this point, since the nodules have not regressed, we will proceed with biopsy of the right arm nodules.  The risks and benefits of the procedure were fully explained to the patient, who gave informed consent.

## 2021-04-22 ENCOUNTER — Encounter (HOSPITAL_COMMUNITY): Payer: Self-pay

## 2021-04-22 NOTE — Patient Instructions (Signed)
Monica Webb  04/22/2021     @PREFPERIOPPHARMACY @   Your procedure is scheduled on 04/27/2021.   Report to [facility] at  0700 A.M.   Call this number if you have problems the morning of surgery:  [phone]   Remember:  Do not eat or drink after midnight.      Take these medicines the morning of surgery with A SIP OF WATER                                        lamictal     Do not wear jewelry, make-up or nail polish.  Do not wear lotions, powders, or perfumes, or deodorant.  Do not shave 48 hours prior to surgery.  Men may shave face and neck.  Do not bring valuables to the hospital.  [facility] is not responsible for any belongings or valuables.  Contacts, dentures or bridgework may not be worn into surgery.  Leave your suitcase in the car.  After surgery it may be brought to your room.  For patients admitted to the hospital, discharge time will be determined by your treatment team.  Patients discharged the day of surgery will not be allowed to drive home and must have someone with them for 24 hours.    Special instructions:   DO NOT smoke tobacco or vape for 24 hours before your procedure.  Please read over the following fact sheets that you were given. Coughing and Deep Breathing, Surgical Site Infection Prevention, Anesthesia Post-op Instructions, and Care and Recovery After Surgery      Incision Care, Adult An incision is a cut that a doctor makes in your skin for surgery. Most times, these cuts are closed after surgery. Your cut from surgery may be closed with: Stitches (sutures). Staples. Skin glue. Skin tape (adhesive) strips. You may need to go back to your doctor to have stitches or staples taken out. This may happen many days or many weeks after your surgery. You need to take good care of your cut so it does not get infected. Follow instructions from your doctor about how to care for your cut. Supplies needed: Soap and water. A clean hand  towel. Wound cleanser. A clean bandage (dressing), if needed. Cream or ointment, if told by your doctor. Clean gauze. How to care for your cut from surgery Cleaning your cut Ask your doctor how to clean your cut. You may need to: Wear medical gloves. Use mild soap and water, or a wound cleanser. Use a clean gauze to pat your cut dry after you clean it. Changing your bandage Wash your hands with soap and water for at least 20 seconds before and after you change your bandage. If you cannot use soap and water, use hand sanitizer. Do not usedisinfectants or antiseptics, such as rubbing alcohol, to clean your wound unless told by your doctor. Change your bandage as told by your doctor. Leavestitches or skin glue in place for at least 2 weeks. Leave tape strips alone unless you are told to take them off. You may trim the edges of the tape strips if they curl up. Put a cream or ointment on your cut. Do this only as told. Cover your cut with a clean bandage. Ask your doctor when you can leave your cut uncovered. Checking for infection Check your cut area every day for signs  of infection. Check for: More redness, swelling, or pain. More fluid or blood. New warmth. Hardness or a new rash around the incision. Pus or a bad smell.  Follow these instructions at home Medicines Take over-the-counter and prescription medicines only as told by your doctor. If you were prescribed an antibiotic medicine, cream, or ointment, use it as told by your doctor. Do not stop using the antibiotic even if you start to feel better. Eating and drinking Eat foods that have a lot of certain nutrients, such as protein, vitamin A, and vitamin C. These foods help your cut heal. Foods rich in protein include meat, fish, eggs, dairy, beans, nuts, and protein drinks. Foods rich in vitamin A include carrots and dark green, leafy vegetables. Foods rich in vitamin C include citrus fruits, tomatoes, broccoli, and  peppers. Drink enough fluid to keep your pee (urine) pale yellow. General instructions  Do not take baths, swim, or use a hot tub. Ask your doctor about taking showers or sponge baths. Limit movement around your cut. This helps with healing. Try not to strain, lift, or exercise for the first 2 weeks, or for as long as told by your doctor. Return to your normal activities as told by your doctor. Ask your doctor what activities are safe for you. Do not scratch, scrub, or pick at your cut. Keep it covered as told by your doctor. Protect your cut from the sun when you are outside for the first 6 months, or for as long as told by your doctor. Cover up the scar area or put on sunscreen that has an SPF of at least 30. Do not use any products that contain nicotine or tobacco, such as cigarettes, e-cigarettes, and chewing tobacco. These can delay cut healing. If you need help quitting, ask your doctor. Keep all follow-up visits. Contact a doctor if: You have any of these signs of infection around your cut: More redness, swelling, or pain. More fluid or blood. New warmth or hardness. Pus or a bad smell. A new rash. You have a fever. You feel like you may vomit (nauseous). You vomit. You are dizzy. Your stitches, staples, skin glue, or tape strips come undone. Your cut gets bigger. You have a fever. Get help right away if: Your cut bleeds through your bandage, and bleeding does not stop with gentle pressure. Your cut opens up and comes apart. These symptoms may be an emergency. Do not wait to see if the symptoms will go away. Get medical help right away. Call your local emergency services (911 in the [place] Do not drive yourself to the hospital. Summary Follow instructions from your doctor about how to care for your cut. Wash your hands with soap and water for at least 20 seconds before and after you change your bandage. If you cannot use soap and water, use hand sanitizer. Check your cut area  every day for signs of infection. Keep all follow-up visits. This information is not intended to replace advice given to you by your health care provider. Make sure you discuss any questions you have with your health care provider. Document Revised: 06/21/2020 Document Reviewed: 06/21/2020 Elsevier Patient Education  [place] After This sheet gives you information about how to care for yourself after your procedure. Your health care provider may also give you more specific instructions. If you have problems or questions, contact your health care provider. What can I expect after the procedure? After the procedure, it is  common to have: Tiredness. Forgetfulness about what happened after the procedure. Impaired judgment for important decisions. Nausea or vomiting. Some difficulty with balance. Follow these instructions at home: For the time period you were told by your health care provider:   Rest as needed. Do not participate in activities where you could fall or become injured. Do not drive or use machinery. Do not drink alcohol. Do not take sleeping pills or medicines that cause drowsiness. Do not make important decisions or sign legal documents. Do not take care of children on your own. Eating and drinking Follow the diet that is recommended by your health care provider. Drink enough fluid to keep your urine pale yellow. If you vomit: Drink water, juice, or soup when you can drink without vomiting. Make sure you have little or no nausea before eating solid foods. General instructions Have a responsible adult stay with you for the time you are told. It is important to have someone help care for you until you are awake and alert. Take over-the-counter and prescription medicines only as told by your health care provider. If you have sleep apnea, surgery and certain medicines can increase your risk for breathing problems. Follow instructions  from your health care provider about wearing your sleep device: Anytime you are sleeping, including during daytime naps. While taking prescription pain medicines, sleeping medicines, or medicines that make you drowsy. Avoid smoking. Keep all follow-up visits as told by your health care provider. This is important. Contact a health care provider if: You keep feeling nauseous or you keep vomiting. You feel light-headed. You are still sleepy or having trouble with balance after 24 hours. You develop a rash. You have a fever. You have redness or swelling around the IV site. Get help right away if: You have trouble breathing. You have new-onset confusion at home. Summary For several hours after your procedure, you may feel tired. You may also be forgetful and have poor judgment. Have a responsible adult stay with you for the time you are told. It is important to have someone help care for you until you are awake and alert. Rest as told. Do not drive or operate machinery. Do not drink alcohol or take sleeping pills. Get help right away if you have trouble breathing, or if you suddenly become confused. This information is not intended to replace advice given to you by your health care provider. Make sure you discuss any questions you have with your health care provider. Document Revised: 12/04/2019 Document Reviewed: 02/20/2019 Elsevier Patient Education  2022 [employer].

## 2021-04-25 ENCOUNTER — Encounter (HOSPITAL_COMMUNITY)
Admission: RE | Admit: 2021-04-25 | Discharge: 2021-04-25 | Disposition: A | Discharge status: Home / Self Care | Payer: BC Managed Care – PPO | Source: Ambulatory Visit | Attending: General Surgery | Admitting: General Surgery

## 2021-04-25 ENCOUNTER — Encounter (HOSPITAL_COMMUNITY): Payer: Self-pay

## 2021-04-25 ENCOUNTER — Other Ambulatory Visit: Payer: Self-pay

## 2021-04-27 ENCOUNTER — Other Ambulatory Visit: Payer: Self-pay

## 2021-04-27 ENCOUNTER — Encounter (HOSPITAL_COMMUNITY): Payer: Self-pay | Admitting: General Surgery

## 2021-04-27 ENCOUNTER — Ambulatory Visit (HOSPITAL_COMMUNITY)
Admission: RE | Admit: 2021-04-27 | Discharge: 2021-04-27 | Disposition: A | Discharge status: Home / Self Care | Payer: BC Managed Care – PPO | Attending: General Surgery | Admitting: General Surgery

## 2021-04-27 ENCOUNTER — Ambulatory Visit (HOSPITAL_COMMUNITY): Payer: BC Managed Care – PPO | Admitting: Certified Registered Nurse Anesthetist

## 2021-04-27 ENCOUNTER — Encounter (HOSPITAL_COMMUNITY)
Admission: RE | Disposition: A | Discharge status: Home / Self Care | Payer: Self-pay | Source: Home / Self Care | Attending: General Surgery

## 2021-04-27 DIAGNOSIS — R2231 Localized swelling, mass and lump, right upper limb: Secondary | ICD-10-CM | POA: Diagnosis present

## 2021-04-27 DIAGNOSIS — D1721 Benign lipomatous neoplasm of skin and subcutaneous tissue of right arm: Secondary | ICD-10-CM

## 2021-04-27 DIAGNOSIS — Z01818 Encounter for other preprocedural examination: Secondary | ICD-10-CM

## 2021-04-27 HISTORY — PX: MASS EXCISION: SHX2000

## 2021-04-27 SURGERY — EXCISION MASS
Anesthesia: General | Site: Arm Upper | Laterality: Right

## 2021-04-27 MED ORDER — LACTATED RINGERS IV SOLN
INTRAVENOUS | Status: DC
  Administered 2021-04-27: 08:00:00 1000 mL via INTRAVENOUS

## 2021-04-27 MED ORDER — ONDANSETRON HCL 4 MG/2ML IJ SOLN
INTRAMUSCULAR | Status: AC
  Filled 2021-04-27: qty 2

## 2021-04-27 MED ORDER — ONDANSETRON HCL 4 MG/2ML IJ SOLN: 4.0000 mg | Freq: Once | INTRAMUSCULAR | Status: DC | PRN

## 2021-04-27 MED ORDER — PROPOFOL 10 MG/ML IV BOLUS
INTRAVENOUS | Status: DC | PRN
  Administered 2021-04-27: 200 mg via INTRAVENOUS

## 2021-04-27 MED ORDER — MIDAZOLAM HCL 2 MG/2ML IJ SOLN
INTRAMUSCULAR | Status: AC
  Filled 2021-04-27: qty 2

## 2021-04-27 MED ORDER — CHLORHEXIDINE GLUCONATE CLOTH 2 % EX PADS: 6.0000 | MEDICATED_PAD | Freq: Once | CUTANEOUS | Status: DC

## 2021-04-27 MED ORDER — FENTANYL CITRATE (PF) 100 MCG/2ML IJ SOLN
INTRAMUSCULAR | Status: AC
  Filled 2021-04-27: qty 2

## 2021-04-27 MED ORDER — FENTANYL CITRATE (PF) 100 MCG/2ML IJ SOLN
INTRAMUSCULAR | Status: DC | PRN
  Administered 2021-04-27 (×4): 25 ug via INTRAVENOUS

## 2021-04-27 MED ORDER — 0.9 % SODIUM CHLORIDE (POUR BTL) OPTIME
TOPICAL | Status: DC | PRN
Start: 2021-04-27 — End: 2021-04-27
  Administered 2021-04-27: 09:00:00 1000 mL

## 2021-04-27 MED ORDER — ONDANSETRON HCL 4 MG/2ML IJ SOLN
INTRAMUSCULAR | Status: DC | PRN
  Administered 2021-04-27: 4 mg via INTRAVENOUS

## 2021-04-27 MED ORDER — MIDAZOLAM HCL 5 MG/5ML IJ SOLN
INTRAMUSCULAR | Status: DC | PRN
  Administered 2021-04-27: 2 mg via INTRAVENOUS

## 2021-04-27 MED ORDER — FENTANYL CITRATE PF 50 MCG/ML IJ SOSY
25.0000 ug | PREFILLED_SYRINGE | INTRAMUSCULAR | Status: DC | PRN
  Administered 2021-04-27: 10:00:00 50 ug via INTRAVENOUS
  Filled 2021-04-27: qty 1

## 2021-04-27 MED ORDER — LIDOCAINE HCL (PF) 2 % IJ SOLN
INTRAMUSCULAR | Status: AC
  Filled 2021-04-27: qty 5

## 2021-04-27 MED ORDER — BUPIVACAINE HCL (PF) 0.5 % IJ SOLN
INTRAMUSCULAR | Status: AC
  Filled 2021-04-27: qty 30

## 2021-04-27 MED ORDER — ORAL CARE MOUTH RINSE: 15.0000 mL | Freq: Once | OROMUCOSAL | Status: AC

## 2021-04-27 MED ORDER — LIDOCAINE HCL (CARDIAC) PF 100 MG/5ML IV SOSY
PREFILLED_SYRINGE | INTRAVENOUS | Status: DC | PRN
  Administered 2021-04-27: 80 mg via INTRAVENOUS

## 2021-04-27 MED ORDER — CHLORHEXIDINE GLUCONATE 0.12 % MT SOLN
15.0000 mL | Freq: Once | OROMUCOSAL | Status: AC
  Administered 2021-04-27: 08:00:00 15 mL via OROMUCOSAL

## 2021-04-27 MED ORDER — KETOROLAC TROMETHAMINE 30 MG/ML IJ SOLN
30.0000 mg | Freq: Once | INTRAMUSCULAR | Status: AC
  Administered 2021-04-27: 10:00:00 30 mg via INTRAVENOUS
  Filled 2021-04-27: qty 1

## 2021-04-27 MED ORDER — PROPOFOL 10 MG/ML IV BOLUS
INTRAVENOUS | Status: AC
  Filled 2021-04-27: qty 20

## 2021-04-27 MED ORDER — BUPIVACAINE HCL (PF) 0.5 % IJ SOLN
INTRAMUSCULAR | Status: DC | PRN
  Administered 2021-04-27: 4 mL

## 2021-04-27 SURGICAL SUPPLY — 26 items
ADH SKN CLS APL DERMABOND .7 (GAUZE/BANDAGES/DRESSINGS) ×1
APL PRP STRL LF ISPRP CHG 10.5 (MISCELLANEOUS) ×1
APPLICATOR CHLORAPREP 10.5 ORG (MISCELLANEOUS) ×3 IMPLANT
CLOTH BEACON ORANGE TIMEOUT ST (SAFETY) ×3 IMPLANT
COVER LIGHT HANDLE STERIS (MISCELLANEOUS) ×6 IMPLANT
DERMABOND ADVANCED (GAUZE/BANDAGES/DRESSINGS) ×1
DERMABOND ADVANCED .7 DNX12 (GAUZE/BANDAGES/DRESSINGS) IMPLANT
DRAPE HALF SHEET 40X57 (DRAPES) ×1 IMPLANT
ELECT REM PT RETURN 9FT ADLT (ELECTROSURGICAL) ×2
ELECTRODE REM PT RTRN 9FT ADLT (ELECTROSURGICAL) ×2 IMPLANT
GLOVE SURG POLYISO LF SZ7.5 (GLOVE) ×3 IMPLANT
GLOVE SURG UNDER POLY LF SZ7 (GLOVE) ×6 IMPLANT
GOWN STRL REUS W/TWL LRG LVL3 (GOWN DISPOSABLE) ×6 IMPLANT
KIT TURNOVER KIT A (KITS) ×3 IMPLANT
MANIFOLD NEPTUNE II (INSTRUMENTS) ×3 IMPLANT
NDL HYPO 25X1 1.5 SAFETY (NEEDLE) ×2 IMPLANT
NEEDLE HYPO 25X1 1.5 SAFETY (NEEDLE) ×2 IMPLANT
NS IRRIG 1000ML POUR BTL (IV SOLUTION) ×3 IMPLANT
PACK MINOR (CUSTOM PROCEDURE TRAY) ×3 IMPLANT
PAD ARMBOARD 7.5X6 YLW CONV (MISCELLANEOUS) ×3 IMPLANT
PAD TELFA 3X4 1S STER (GAUZE/BANDAGES/DRESSINGS) ×1 IMPLANT
SET BASIN LINEN APH (SET/KITS/TRAYS/PACK) ×3 IMPLANT
SUT MNCRL AB 4-0 PS2 18 (SUTURE) ×1 IMPLANT
SUT VIC AB 3-0 SH 27 (SUTURE) ×2
SUT VIC AB 3-0 SH 27X BRD (SUTURE) IMPLANT
SYR CONTROL 10ML LL (SYRINGE) ×3 IMPLANT

## 2021-04-27 NOTE — Anesthesia Postprocedure Evaluation (Signed)
Anesthesia Post Note  Patient: Monica Webb  Procedure(s) Performed: EXCISION MASS; ARM (Right: Arm Upper)  Patient location during evaluation: Phase II Anesthesia Type: General Level of consciousness: awake Pain management: pain level controlled Vital Signs Assessment: post-procedure vital signs reviewed and stable Respiratory status: spontaneous breathing and respiratory function stable Cardiovascular status: blood pressure returned to baseline and stable Postop Assessment: no headache and no apparent nausea or vomiting Anesthetic complications: no Comments: Late entry   No notable events documented.   Last Vitals:  Vitals:   04/27/21 0945 04/27/21 1000  BP: 110/78 100/73  Pulse: (!) 52 (!) 52  Resp: 12 18  Temp:    SpO2: 100% 100%    Last Pain:  Vitals:   04/27/21 1014  TempSrc:   PainSc: [place]

## 2021-04-27 NOTE — Anesthesia Preprocedure Evaluation (Signed)
Anesthesia Evaluation  Patient identified by MRN, date of birth, ID band Patient awake    Reviewed: Allergy & Precautions, H&P , NPO status , Patient's Chart, lab work & pertinent test results, reviewed documented beta blocker date and time   Airway Mallampati: II  TM Distance: >3 FB Neck ROM: full    Dental no notable dental hx.    Pulmonary neg pulmonary ROS,    Pulmonary exam normal breath sounds clear to auscultation       Cardiovascular Exercise Tolerance: Good negative cardio ROS   Rhythm:regular Rate:Normal     Neuro/Psych  Headaches, Seizures -, Well Controlled,  negative psych ROS   GI/Hepatic negative GI ROS, Neg liver ROS,   Endo/Other  negative endocrine ROS  Renal/GU negative Renal ROS  negative genitourinary   Musculoskeletal   Abdominal   Peds  Hematology negative hematology ROS (+)   Anesthesia Other Findings   Reproductive/Obstetrics negative OB ROS                             Anesthesia Physical Anesthesia Plan  ASA: 2  Anesthesia Plan: General   Post-op Pain Management:    Induction:   PONV Risk Score and Plan: Ondansetron  Airway Management Planned:   Additional Equipment:   Intra-op Plan:   Post-operative Plan:   Informed Consent: I have reviewed the patients History and Physical, chart, labs and discussed the procedure including the risks, benefits and alternatives for the proposed anesthesia with the patient or authorized representative who has indicated his/her understanding and acceptance.     Dental Advisory Given  Plan Discussed with: CRNA  Anesthesia Plan Comments:         Anesthesia Quick Evaluation

## 2021-04-27 NOTE — Transfer of Care (Signed)
Immediate Anesthesia Transfer of Care Note  Patient: Eddie North  Procedure(s) Performed: EXCISION MASS; ARM (Right: Arm Upper)  Patient Location: PACU  Anesthesia Type:General  Level of Consciousness: awake, alert  and oriented  Airway & Oxygen Therapy: Patient Spontanous Breathing and Patient connected to nasal cannula oxygen  Post-op Assessment: Report given to RN, Post -op Vital signs reviewed and stable, Patient moving all extremities X 4 and Patient able to stick tongue midline  Post vital signs: Reviewed  Last Vitals:  Vitals Value Taken Time  BP 108/69 04/27/21 0909  Temp 98.4   Pulse 75 04/27/21 0910  Resp 13 04/27/21 0910  SpO2 100 % 04/27/21 0910  Vitals shown include unvalidated device data.  Last Pain:  Vitals:   04/27/21 0719  TempSrc: Oral  PainSc: 0-No pain      Patients Stated Pain Goal: 8 (67/34/19 3790)  Complications: No notable events documented.

## 2021-04-27 NOTE — Interval H&P Note (Signed)
History and Physical Interval Note:  04/27/2021 8:22 AM  Monica Webb  has presented today for surgery, with the diagnosis of Mass, arm, right.  The various methods of treatment have been discussed with the patient and family. After consideration of risks, benefits and other options for treatment, the patient has consented to  Procedure(s): EXCISION MASS; ARM (Right) as a surgical intervention.  The patient's history has been reviewed, patient examined, no change in status, stable for surgery.  I have reviewed the patient's chart and labs.  Questions were answered to the patient's satisfaction.     Aviva Signs

## 2021-04-27 NOTE — Op Note (Signed)
Patient:  Monica Webb  DOB:  1980/09/20  MRN:  [id number]   Preop Diagnosis: Soft tissue masses, right forearm  Postop Diagnosis: Same  Procedure: Excision of soft tissue masses, right forearm  Surgeon: Aviva Signs, MD  Anes: General  Indications: Patient is a 41 year old white female who has been developing subcutaneous nodules in the right arm.  They are sometimes tender to touch.  The patient now comes for excision and biopsy of the masses.  The risks and benefits of the procedure were fully explained to the patient, who gave informed consent.  Procedure note: The patient was placed in the supine position.  After general anesthesia was administered, the right arm was prepped and draped using the usual sterile technique with ChloraPrep.  Surgical site confirmation was performed.  A longitudinal incision was made just below the elbow and the right forearm.  In the subcutaneous tissue, multiple nodules that appear to be lipomas were found.  These were excised without difficulty.  They were sent to pathology further examination.  The total incision line was 3-1/2 cm.  0.5% Sensorcaine was instilled into the surrounding wound.  The subcutaneous layer was reapproximated using a 3-0 Vicryl interrupted suture.  The skin was closed using a 4-0 Monocryl subcuticular suture.  Dermabond was applied.  All tape and needle counts were correct at the end of the procedure.  The patient was awakened and transferred to PACU in stable condition.  Complications: None  EBL: Minimal  Specimen: Soft tissue nodules, right forearm

## 2021-04-27 NOTE — Anesthesia Procedure Notes (Signed)
Procedure Name: LMA Insertion Date/Time: 04/27/2021 8:36 AM Performed by: Maude Leriche, CRNA Pre-anesthesia Checklist: Patient identified, Emergency Drugs available, Suction available, Patient being monitored and Timeout performed Patient Re-evaluated:Patient Re-evaluated prior to induction Oxygen Delivery Method: Circle system utilized Preoxygenation: Pre-oxygenation with 100% oxygen Induction Type: IV induction LMA: LMA inserted LMA Size: 4.0 Number of attempts: 1 Placement Confirmation: positive ETCO2 and breath sounds checked- equal and bilateral Tube secured with: Tape

## 2021-04-28 ENCOUNTER — Encounter (HOSPITAL_COMMUNITY): Payer: Self-pay | Admitting: General Surgery

## 2021-04-28 ENCOUNTER — Other Ambulatory Visit (INDEPENDENT_AMBULATORY_CARE_PROVIDER_SITE_OTHER): Payer: BC Managed Care – PPO | Admitting: General Surgery

## 2021-04-28 ENCOUNTER — Telehealth: Payer: Self-pay | Admitting: *Deleted

## 2021-04-28 DIAGNOSIS — Z09 Encounter for follow-up examination after completed treatment for conditions other than malignant neoplasm: Secondary | ICD-10-CM

## 2021-04-28 LAB — SURGICAL PATHOLOGY

## 2021-04-28 MED ORDER — HYDROCODONE-ACETAMINOPHEN 5-325 MG PO TABS: 1.0000 | ORAL_TABLET | ORAL | 0 refills | Status: DC | PRN

## 2021-04-28 NOTE — Telephone Encounter (Signed)
Received call from patient [phone]~ telephone.v  Surgical Date: 04/27/2021 Procedure: Excision Lipoma  Pain Management: currently taking IBU/APAP that is effective during the day. Reports that pain is worse at night. States that pain is keeping her awake at night.   Incision: clean and dry with minimal drainage  Allergies: PCN, Sulfa Pharmacy: [facility]

## 2021-04-28 NOTE — Progress Notes (Signed)
Norco for postoperative pain.

## 2021-05-03 ENCOUNTER — Other Ambulatory Visit: Payer: Self-pay

## 2021-05-03 ENCOUNTER — Ambulatory Visit (INDEPENDENT_AMBULATORY_CARE_PROVIDER_SITE_OTHER): Payer: BC Managed Care – PPO | Admitting: General Surgery

## 2021-05-03 ENCOUNTER — Encounter: Payer: Self-pay | Admitting: General Surgery

## 2021-05-03 VITALS — BP 116/75 | HR 64 | Temp 97.6°F | Resp 16 | Ht 65.0 in | Wt 191.0 lb

## 2021-05-03 DIAGNOSIS — Z09 Encounter for follow-up examination after completed treatment for conditions other than malignant neoplasm: Secondary | ICD-10-CM

## 2021-05-03 NOTE — Progress Notes (Signed)
Subjective:     Monica Webb  Patient here for postoperative visit.  States that her incision is not hurting anymore.  She still has the arm weakness and discomfort to palpation deep in the arm. Objective:    BP 116/75    Pulse 64    Temp 97.6 F (36.4 C) (Other (Comment))    Resp 16    Ht 5\' 5"  (1.651 m)    Wt 191 lb (86.6 kg)    LMP 04/21/2012    SpO2 99%    BMI 31.78 kg/m   General:  alert, cooperative, and no distress  Right arm incision well-healed Final pathology reveals benign lipomas     Assessment:    Doing well postoperatively.    Plan:   I told her I cannot explain her symptomatology.  She may need a further work-up to rule out a neuromuscular etiology of her right arm pain and weakness.  She states she will go back to Dr. Quillian Quince for further evaluation and treatment.  Follow-up here as needed.

## 2021-11-12 IMAGING — US US ABDOMEN COMPLETE
1 series · 14 of 25 positions shown · non-contrast
Comparison: CT abdomen and pelvis 07/15/2015

CLINICAL DATA: RIGHT upper quadrant and flank pain, nausea

EXAM:
ABDOMEN ULTRASOUND COMPLETE

[Series 1: us abdomen complete · 14 of 131 slices shown]
[im 1/131]
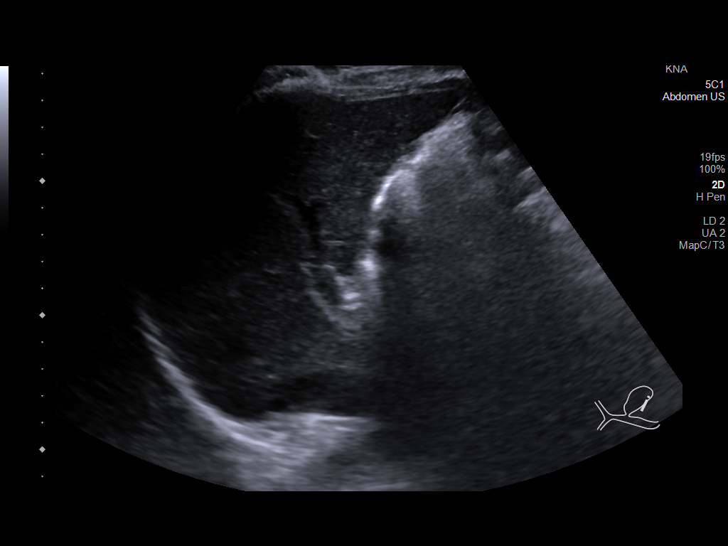
[im 11/131]
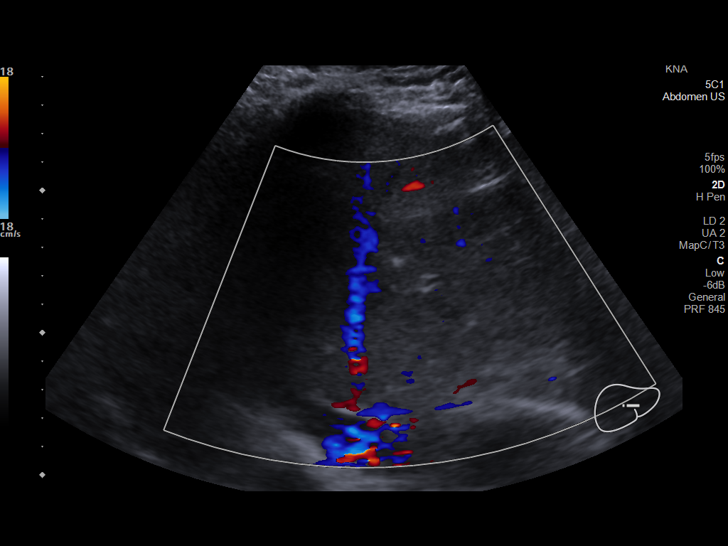
[im 22/131]
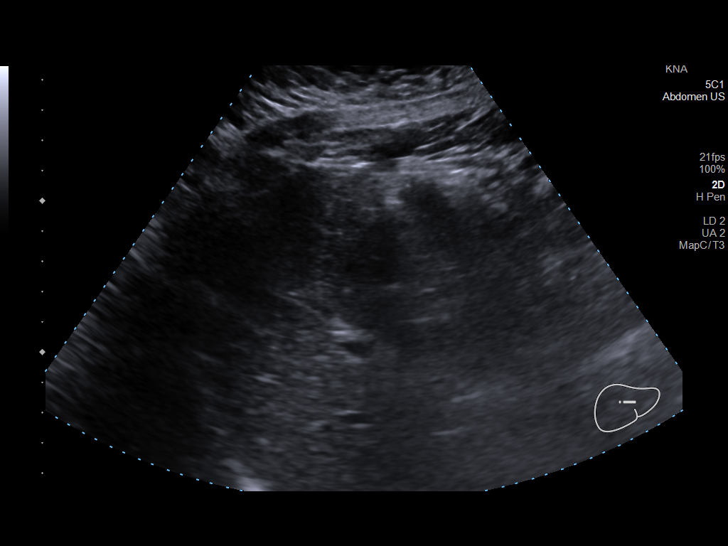
[im 33/131]
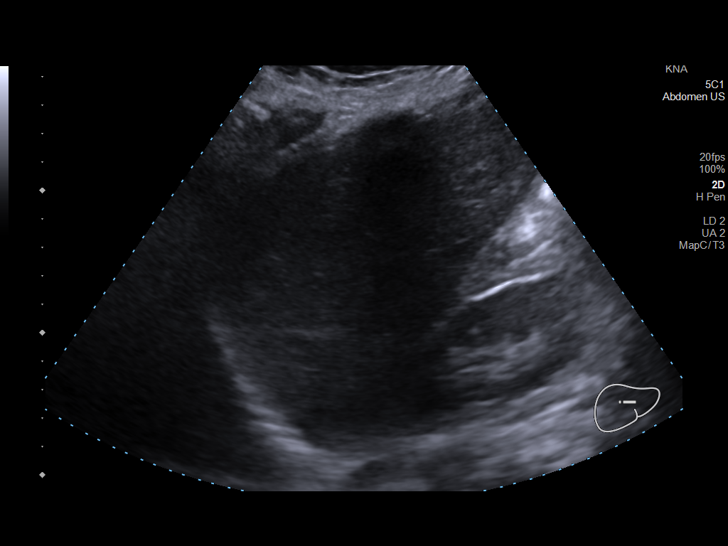
[im 44/131]
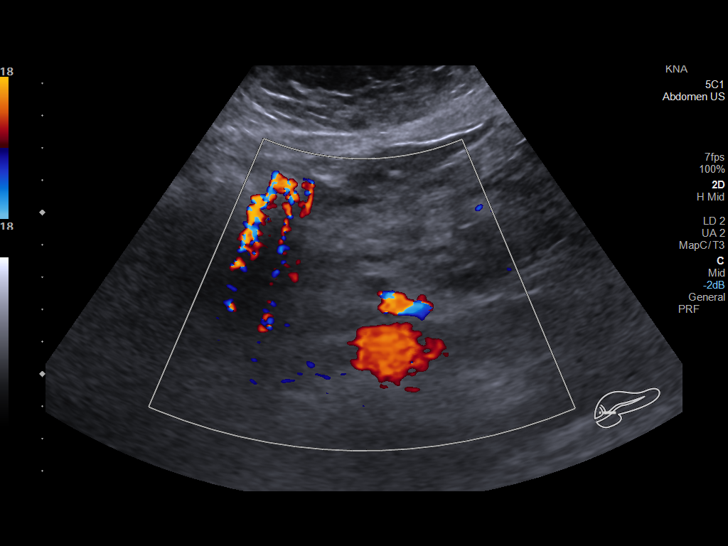
[im 49/131]
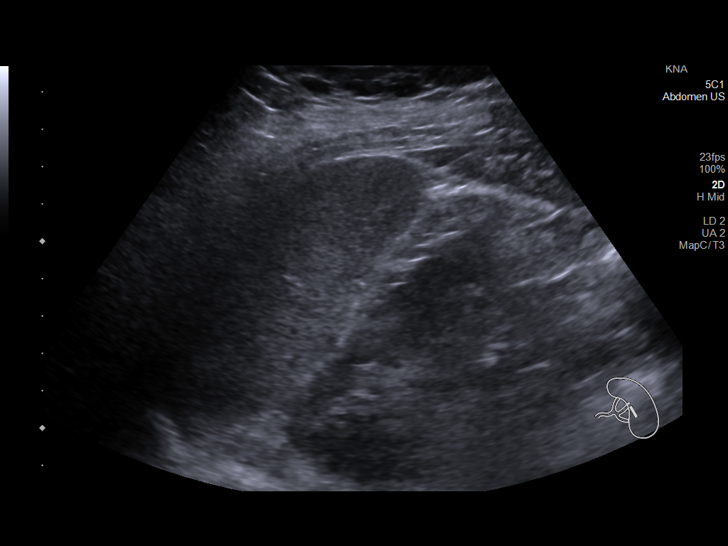
[im 60/131]
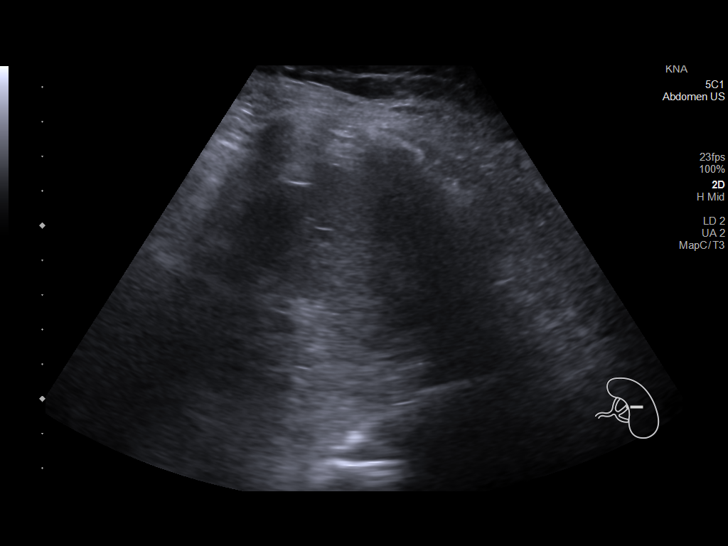
[im 71/131]
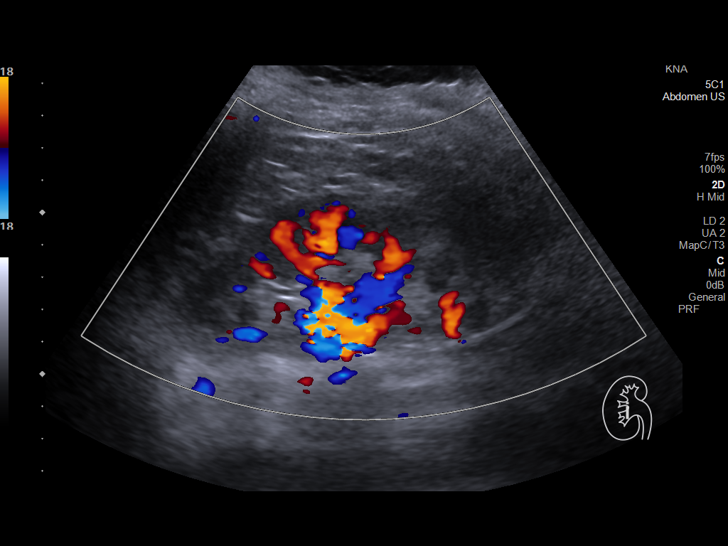
[im 82/131]
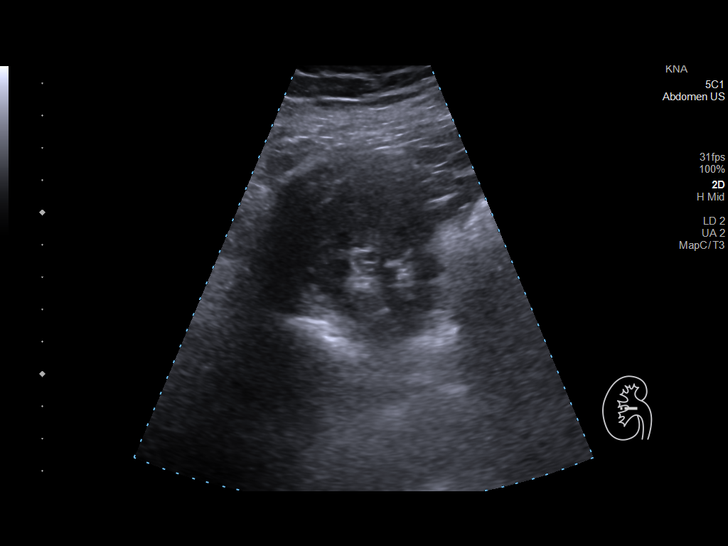
[im 87/131]
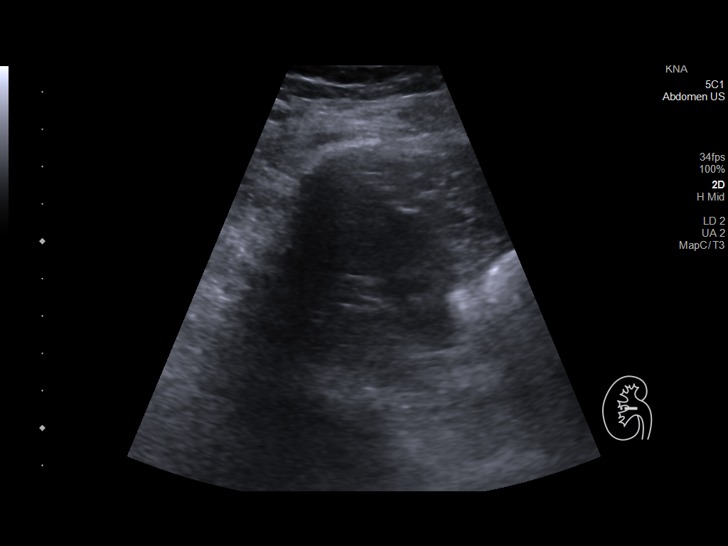
[im 98/131]
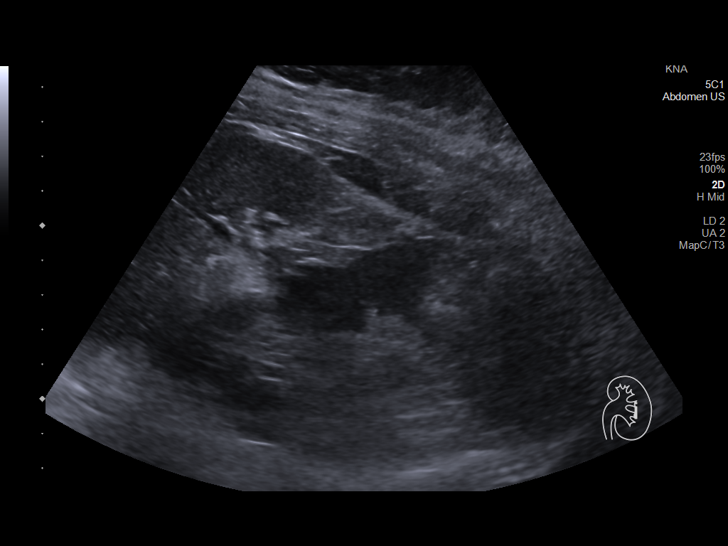
[im 109/131]
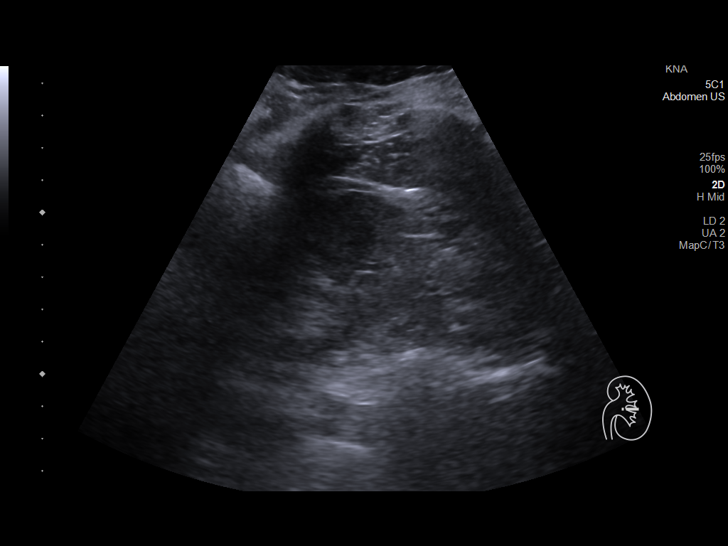
[im 120/131]
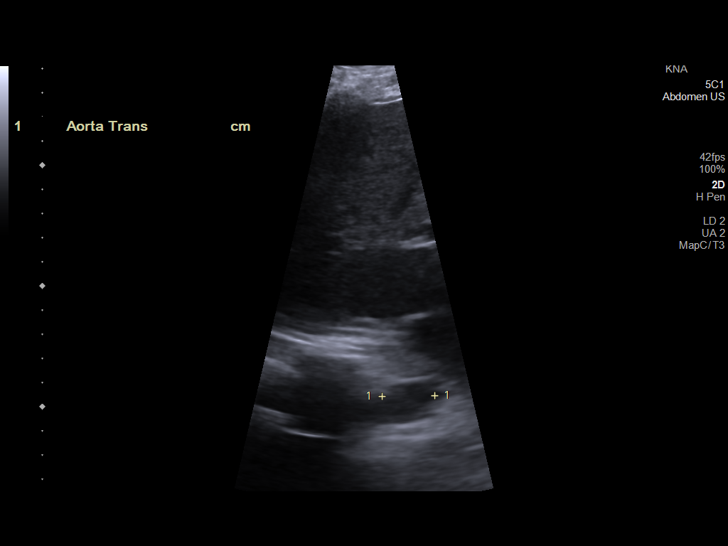
[im 131/131]
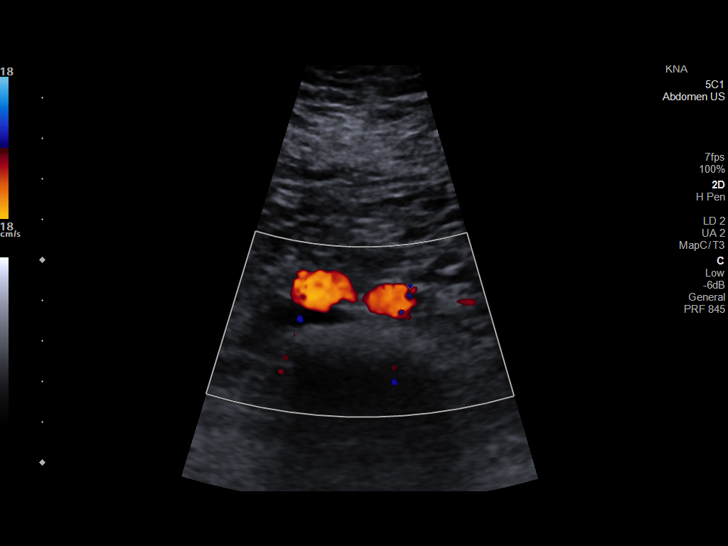

[14 of 25 positions shown; findings below may reference images not displayed]

FINDINGS: Gallbladder: Surgically absent

Common bile duct: Diameter: 3 mm, normal

Liver: Minimally coarsened echogenicity. Scattered areas of
shadowing related to ribs and lung bases. No discrete mass or
nodularity. Portal vein is patent on color Doppler imaging with
normal direction of blood flow towards the liver.

IVC: Normal appearance

Pancreas: Suboptimally visualized due to bowel gas

Spleen: Normal appearance, 7.8 cm length

Right Kidney: Length: 11.9 cm. Normal morphology without mass or
hydronephrosis.

Left Kidney: Length: 11.9 cm. Normal morphology without mass or
hydronephrosis.

Abdominal aorta: Mid to distal portions poorly visualized due to
bowel gas.

Other findings: No free fluid
IMPRESSION: Post cholecystectomy.

Nonvisualization of pancreas with limited visualization of aorta and
portions of liver.

No acute upper abdominal sonographic abnormalities identified.

## 2022-05-10 IMAGING — US US EXTREM  UP VENOUS*R*
1 series · 12 of 24 positions shown · non-contrast
Comparison: None.
COMPARISON: None.

Addendum:
CLINICAL DATA: Right arm pain and weakness.



[Series 1: venous ue · portal-venous · 27 acquisitions, 12 frames shown]
[im 1/27]
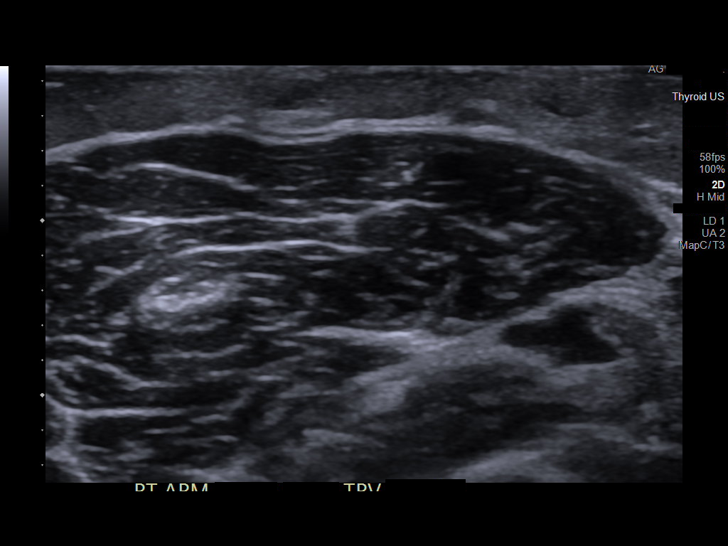
[im 2/27]
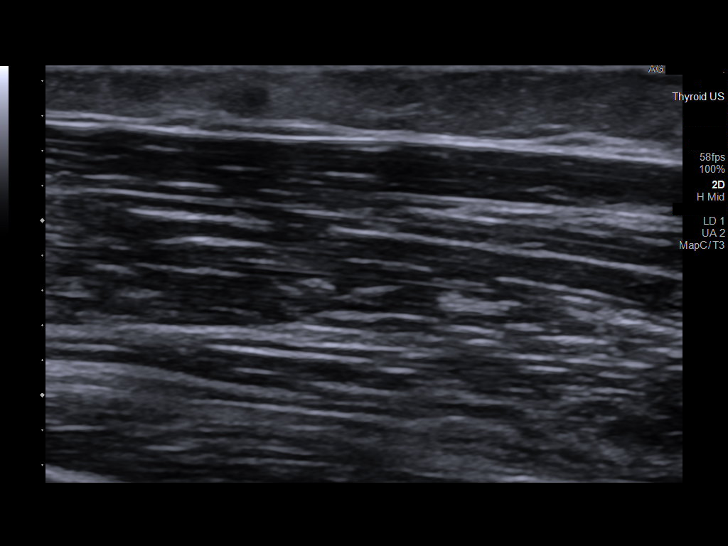
[im 4/27]
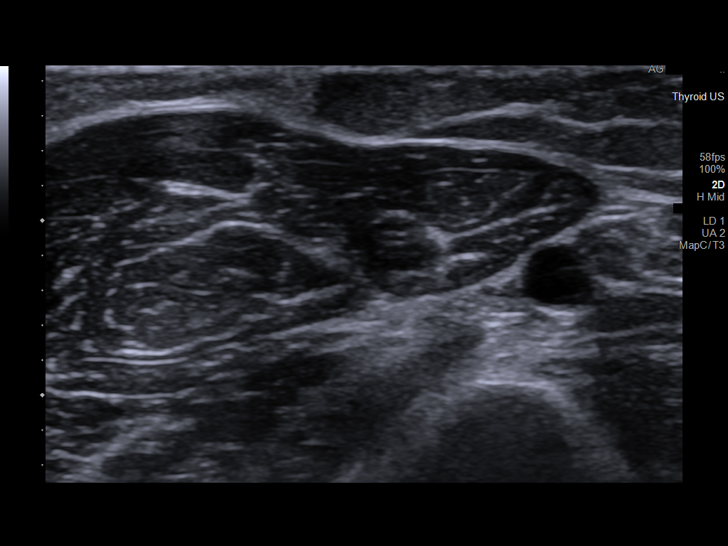
[im 7/27]
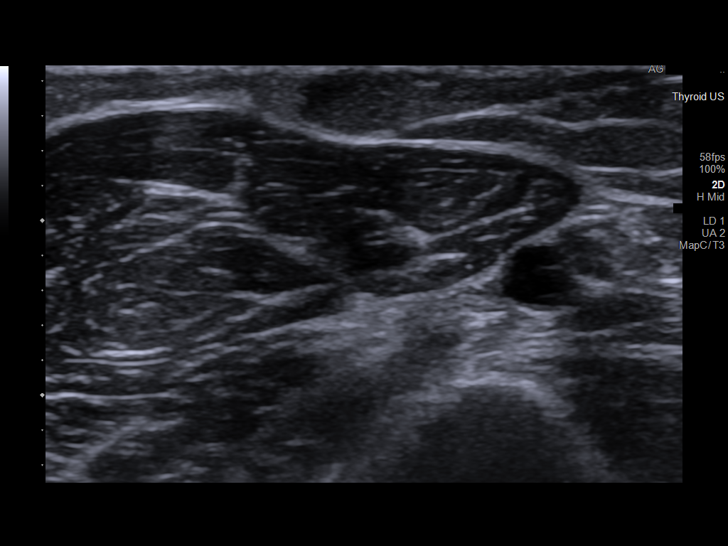
[im 10/27]
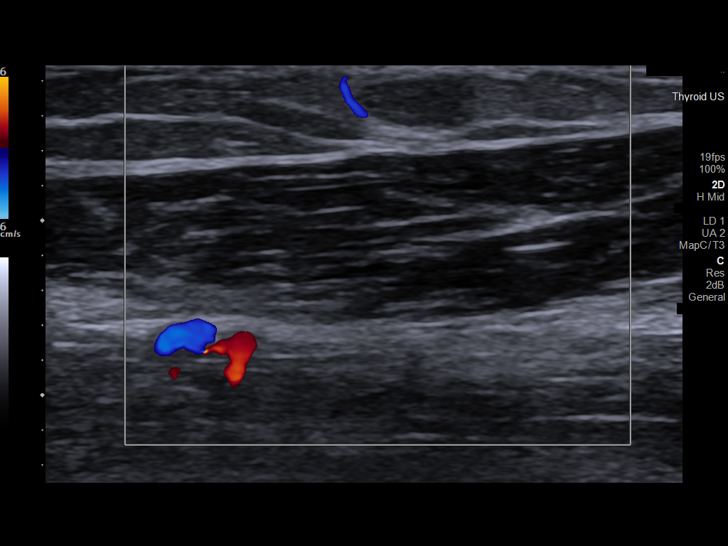
[im 12/27]
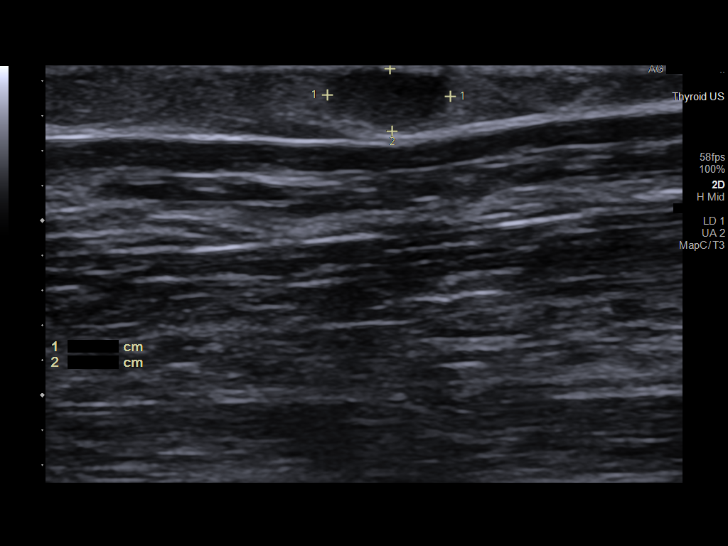
[im 14/27]
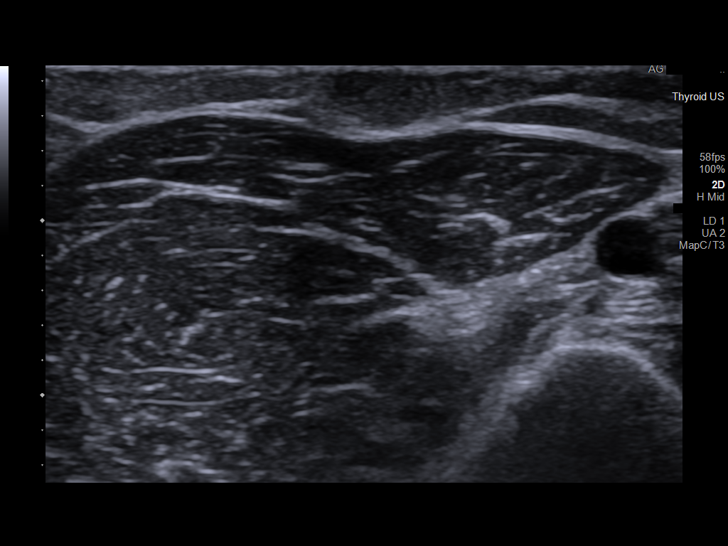
[im 16/27]
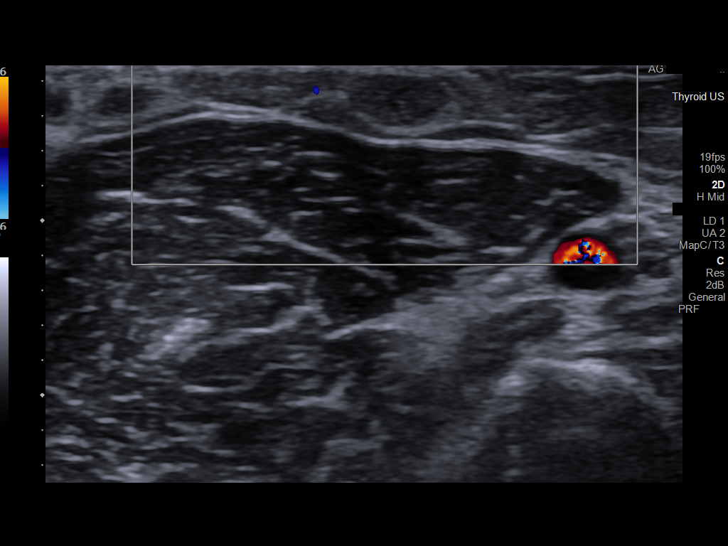
[im 19/27]
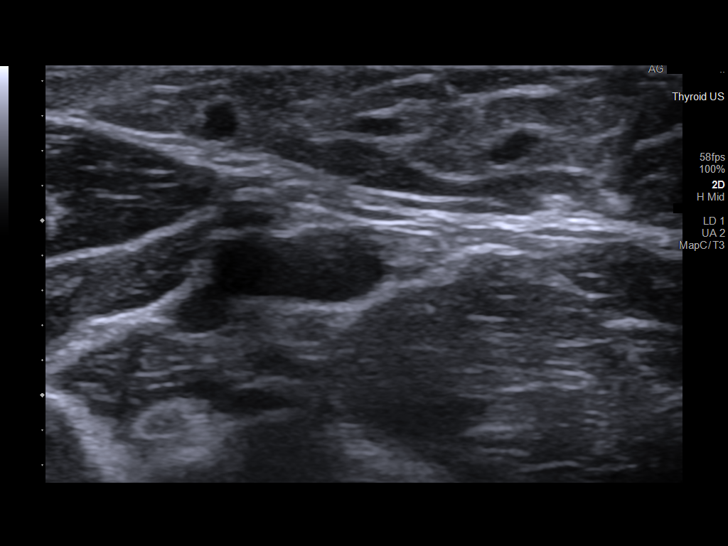
[im 22/27]
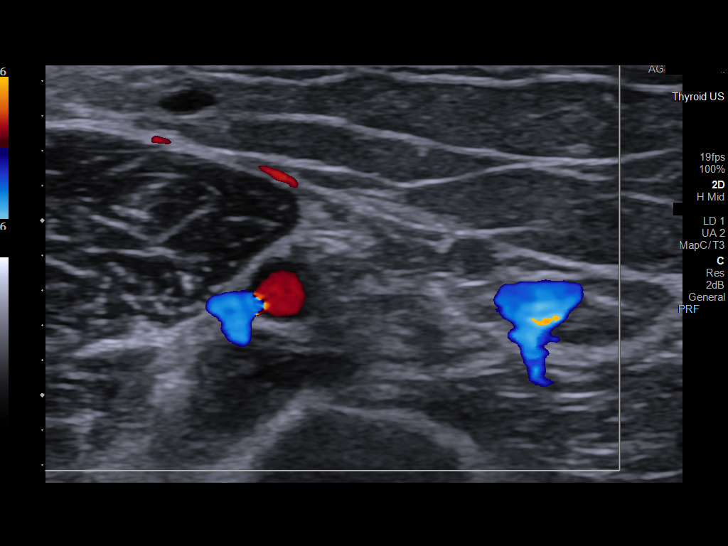
[im 24/27]
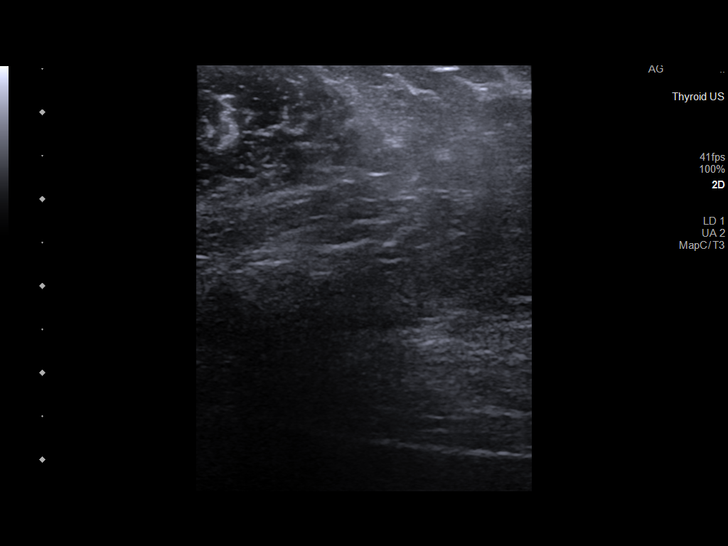
[im 27/27]
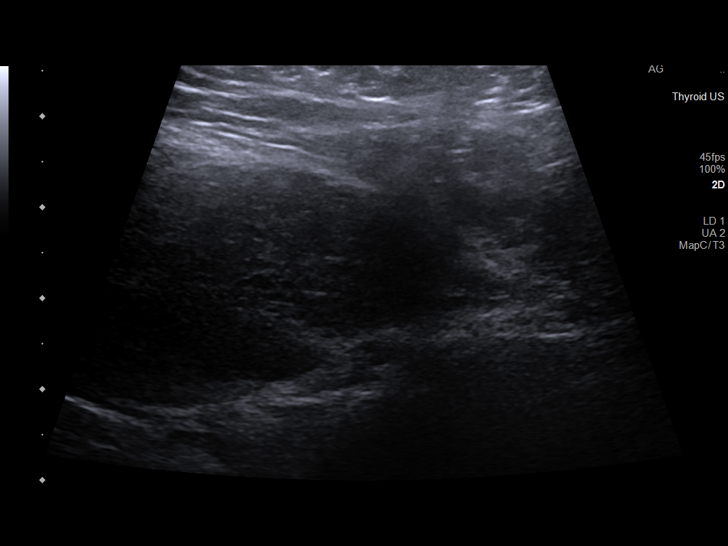

[12 of 24 positions shown; findings below may reference images not displayed]

FINDINGS: Contralateral Subclavian Vein: No evidence of thrombus. Normal
compressibility, color Doppler flow and phasicity.

Internal Jugular Vein: No evidence of thrombus. Normal
compressibility, color Doppler flow and phasicity.

Subclavian Vein: No evidence of thrombus. Normal compressibility,
color Doppler flow and phasicity.

Axillary Vein: No evidence of thrombus. Normal compressibility,
respiratory phasicity and response to augmentation.

Cephalic Vein: No evidence of thrombus. Normal compressibility,
respiratory phasicity and response to augmentation.

Basilic Vein: No evidence of thrombus. Normal compressibility,
respiratory phasicity and response to augmentation.

Brachial Veins: No evidence of thrombus. Normal compressibility,
respiratory phasicity and response to augmentation.

Radial Veins: No evidence of thrombus. Normal compressibility, color
Doppler flow and augmentation.

Ulnar Veins: No evidence of thrombus. Normal compressibility,
respiratory phasicity and response to augmentation.

Other Findings: There are hypoechoic noncompressible structures in
the superficial structures at the area of concern in the right upper
arm biceps area. No vascular flow within these structures.
IMPRESSION: 1. Negative for deep venous thrombosis in the right upper extremity.
2. Indeterminate hypoechoic structures at the area of concern near
the right biceps. These could represent thrombosed superficial veins
but uncertain. Patient will be brought back for additional imaging
of this area.

ADDENDUM:
Patient was brought back for additional images on 02/04/2021. The
small superficial nodules in the right upper arm were evaluated.
There are multiple small hypoechoic nodules that do not connect to
one another. These nodules do not appear to be vascular in etiology.
There is an index superficial nodule that measures 0.8 x 0.4 x
cm. Some of the small nodules are anechoic and likely cystic.

Multiple small superficial nodular structures are indeterminate.
These could be post inflammatory. Consider excision or aspiration of
a nodule.

*** End of Addendum ***
FINDINGS: Contralateral Subclavian Vein: No evidence of thrombus. Normal
compressibility, color Doppler flow and phasicity.

Internal Jugular Vein: No evidence of thrombus. Normal
compressibility, color Doppler flow and phasicity.

Subclavian Vein: No evidence of thrombus. Normal compressibility,
color Doppler flow and phasicity.

Axillary Vein: No evidence of thrombus. Normal compressibility,
respiratory phasicity and response to augmentation.

Cephalic Vein: No evidence of thrombus. Normal compressibility,
respiratory phasicity and response to augmentation.

Basilic Vein: No evidence of thrombus. Normal compressibility,
respiratory phasicity and response to augmentation.

Brachial Veins: No evidence of thrombus. Normal compressibility,
respiratory phasicity and response to augmentation.

Radial Veins: No evidence of thrombus. Normal compressibility, color
Doppler flow and augmentation.

Ulnar Veins: No evidence of thrombus. Normal compressibility,
respiratory phasicity and response to augmentation.

Other Findings: There are hypoechoic noncompressible structures in
the superficial structures at the area of concern in the right upper
arm biceps area. No vascular flow within these structures.
IMPRESSION: 1. Negative for deep venous thrombosis in the right upper extremity.
2. Indeterminate hypoechoic structures at the area of concern near
the right biceps. These could represent thrombosed superficial veins
but uncertain. Patient will be brought back for additional imaging
of this area.
